# Patient Record
Sex: Female | Born: 1950 | Race: White | Hispanic: No | Smoking: Former smoker
Health system: Southern US, Community
[De-identification: ages and names within clinical notes are randomized; demographics above are authoritative.]

## PROBLEM LIST (undated history)

## (undated) DIAGNOSIS — E039 Hypothyroidism, unspecified: Secondary | ICD-10-CM

## (undated) DIAGNOSIS — C801 Malignant (primary) neoplasm, unspecified: Secondary | ICD-10-CM

## (undated) DIAGNOSIS — E119 Type 2 diabetes mellitus without complications: Secondary | ICD-10-CM

## (undated) DIAGNOSIS — E785 Hyperlipidemia, unspecified: Secondary | ICD-10-CM

## (undated) DIAGNOSIS — I10 Essential (primary) hypertension: Secondary | ICD-10-CM

## (undated) DIAGNOSIS — I2510 Atherosclerotic heart disease of native coronary artery without angina pectoris: Secondary | ICD-10-CM

## (undated) HISTORY — PX: COLONOSCOPY WITH PROPOFOL: SHX5780

## (undated) HISTORY — PX: CHOLECYSTECTOMY: SHX55

## (undated) HISTORY — PX: VAGINAL HYSTERECTOMY: SUR661

---

## 2003-06-21 ENCOUNTER — Other Ambulatory Visit: Payer: Self-pay

## 2004-07-21 ENCOUNTER — Ambulatory Visit: Payer: Self-pay | Admitting: General Practice

## 2005-01-11 ENCOUNTER — Ambulatory Visit: Payer: Self-pay | Admitting: Endocrinology

## 2005-03-16 ENCOUNTER — Emergency Department: Payer: Self-pay | Admitting: Emergency Medicine

## 2005-03-16 ENCOUNTER — Other Ambulatory Visit: Payer: Self-pay

## 2005-03-21 ENCOUNTER — Other Ambulatory Visit: Payer: Self-pay

## 2005-03-21 ENCOUNTER — Ambulatory Visit: Payer: Self-pay | Admitting: Internal Medicine

## 2005-04-19 ENCOUNTER — Encounter: Payer: Self-pay | Admitting: Internal Medicine

## 2005-04-28 ENCOUNTER — Encounter: Payer: Self-pay | Admitting: Internal Medicine

## 2005-05-29 ENCOUNTER — Encounter: Payer: Self-pay | Admitting: Internal Medicine

## 2005-06-28 ENCOUNTER — Encounter: Payer: Self-pay | Admitting: Internal Medicine

## 2005-07-06 ENCOUNTER — Ambulatory Visit: Payer: Self-pay | Admitting: Gastroenterology

## 2006-06-07 ENCOUNTER — Ambulatory Visit: Payer: Self-pay | Admitting: Gastroenterology

## 2008-01-16 ENCOUNTER — Ambulatory Visit: Payer: Self-pay | Admitting: Gastroenterology

## 2010-05-24 ENCOUNTER — Ambulatory Visit: Payer: Self-pay | Admitting: Family Medicine

## 2010-11-25 ENCOUNTER — Ambulatory Visit: Payer: Self-pay | Admitting: Family Medicine

## 2011-03-07 ENCOUNTER — Ambulatory Visit: Payer: Self-pay | Admitting: Gastroenterology

## 2012-01-03 ENCOUNTER — Ambulatory Visit: Payer: Self-pay | Admitting: Family Medicine

## 2013-01-03 ENCOUNTER — Ambulatory Visit: Payer: Self-pay | Admitting: Family Medicine

## 2014-01-06 ENCOUNTER — Ambulatory Visit: Payer: Self-pay | Admitting: Nurse Practitioner

## 2014-09-05 ENCOUNTER — Emergency Department
Admission: EM | Admit: 2014-09-05 | Discharge: 2014-09-05 | Disposition: A | Discharge status: Home / Self Care | Payer: BLUE CROSS/BLUE SHIELD | Attending: Emergency Medicine | Admitting: Emergency Medicine

## 2014-09-05 DIAGNOSIS — S91112A Laceration without foreign body of left great toe without damage to nail, initial encounter: Secondary | ICD-10-CM | POA: Diagnosis present

## 2014-09-05 DIAGNOSIS — Z23 Encounter for immunization: Secondary | ICD-10-CM | POA: Insufficient documentation

## 2014-09-05 DIAGNOSIS — Y9289 Other specified places as the place of occurrence of the external cause: Secondary | ICD-10-CM | POA: Insufficient documentation

## 2014-09-05 DIAGNOSIS — Y9389 Activity, other specified: Secondary | ICD-10-CM | POA: Insufficient documentation

## 2014-09-05 DIAGNOSIS — Y998 Other external cause status: Secondary | ICD-10-CM | POA: Insufficient documentation

## 2014-09-05 DIAGNOSIS — Y288XXA Contact with other sharp object, undetermined intent, initial encounter: Secondary | ICD-10-CM | POA: Diagnosis not present

## 2014-09-05 MED ORDER — TETANUS-DIPHTH-ACELL PERTUSSIS 5-2.5-18.5 LF-MCG/0.5 IM SUSP
INTRAMUSCULAR | Status: DC
Start: 2014-09-05 — End: 2014-09-06
  Filled 2014-09-05: qty 0.5

## 2014-09-05 MED ORDER — TETANUS-DIPHTH-ACELL PERTUSSIS 5-2.5-18.5 LF-MCG/0.5 IM SUSP
0.5000 mL | Freq: Once | INTRAMUSCULAR | Status: AC
  Administered 2014-09-05: 0.5 mL via INTRAMUSCULAR

## 2014-09-05 MED ORDER — SULFAMETHOXAZOLE-TRIMETHOPRIM 800-160 MG PO TABS: 1.0000 | ORAL_TABLET | Freq: Two times a day (BID) | ORAL | Status: DC

## 2014-09-05 MED ORDER — CIPROFLOXACIN HCL 500 MG PO TABS: 500.0000 mg | ORAL_TABLET | Freq: Two times a day (BID) | ORAL | Status: DC

## 2014-09-05 MED ORDER — LIDOCAINE HCL (PF) 1 % IJ SOLN: 2.0000 mL | Freq: Once | INTRAMUSCULAR | Status: DC

## 2014-09-05 MED ORDER — TRAMADOL HCL 50 MG PO TABS: 50.0000 mg | ORAL_TABLET | Freq: Four times a day (QID) | ORAL | Status: DC | PRN

## 2014-09-05 MED ORDER — LIDOCAINE HCL (PF) 1 % IJ SOLN
INTRAMUSCULAR | Status: DC
Start: 2014-09-05 — End: 2014-09-06
  Filled 2014-09-05: qty 5

## 2014-09-05 NOTE — Discharge Instructions (Signed)

## 2014-09-05 NOTE — ED Notes (Signed)
Patient with no complaints at this time. Respirations even and unlabored. Skin warm/dry. Discharge instructions reviewed with patient at this time. Patient given opportunity to voice concerns/ask questions. Patient discharged at this time and left Emergency Department with steady gait.   

## 2014-09-05 NOTE — ED Provider Notes (Signed)
CSN: [id number]     Arrival date & time 09/05/14  2108 History   First MD Initiated Contact with Patient 09/05/14 2133     Chief Complaint  Patient presents with  . Extremity Laceration     (Consider location/radiation/quality/duration/timing/severity/associated sxs/prior Treatment) HPI  64 year old female presents to the emergency department ambulatory for evaluation of left great toe laceration. Patient states she cut the bottom of her left great toe on a piece of metal that was bent after she had vacuumed her living room floor. Piece of metal was from the air conditioning vent. Pain is 4 out of 10. Her tetanus is not up-to-date. She is able to ambulate. Is not a medications for pain. She denies any numbness or tingling.   No past medical history on file. No past surgical history on file. No family history on file. History  Substance Use Topics  . Smoking status: Not on file  . Smokeless tobacco: Not on file  . Alcohol Use: Not on file   OB History    No data available     Review of Systems  Constitutional: Negative for fever, chills, activity change and fatigue.  HENT: Negative for congestion, sinus pressure and sore throat.   Eyes: Negative for visual disturbance.  Respiratory: Negative for cough, chest tightness and shortness of breath.   Cardiovascular: Negative for chest pain and leg swelling.  Gastrointestinal: Negative for nausea, vomiting, abdominal pain and diarrhea.  Genitourinary: Negative for dysuria.  Musculoskeletal: Negative for arthralgias and gait problem.  Skin: Positive for wound.  Neurological: Negative for weakness, numbness and headaches.  Hematological: Negative for adenopathy.  Psychiatric/Behavioral: Negative for behavioral problems, confusion and agitation.      Allergies  Review of patient's allergies indicates no known allergies.  Home Medications   Prior to Admission medications   Medication Sig Start Date End Date Taking? Authorizing  Provider  ciprofloxacin (CIPRO) 500 MG tablet Take 1 tablet (500 mg total) by mouth 2 (two) times daily. 09/05/14   Duanne Guess, PA-C  sulfamethoxazole-trimethoprim (BACTRIM DS,SEPTRA DS) 800-160 MG per tablet Take 1 tablet by mouth 2 (two) times daily. 09/05/14   Duanne Guess, PA-C  traMADol (ULTRAM) 50 MG tablet Take 1 tablet (50 mg total) by mouth every 6 (six) hours as needed. 09/05/14   Duanne Guess, PA-C   BP 159/63 mmHg  Pulse 76  Temp(Src) 97.6 F (36.4 C) (Oral)  Resp 18  Ht 5\' 3"  (1.6 m)  Wt 154 lb (69.854 kg)  BMI 27.29 kg/m2  SpO2 96% Physical Exam  Constitutional: She is oriented to person, place, and time. She appears well-developed and well-nourished. No distress.  HENT:  Head: Normocephalic and atraumatic.  Mouth/Throat: Oropharynx is clear and moist.  Eyes: EOM are normal. Pupils are equal, round, and reactive to light. Right eye exhibits no discharge. Left eye exhibits no discharge.  Neck: Normal range of motion. Neck supple.  Cardiovascular: Normal rate and intact distal pulses.   Pulmonary/Chest: Effort normal. No respiratory distress.  Musculoskeletal: Normal range of motion. She exhibits no edema.  Normal range of motion of the right knee and ankle and toes. No deformity noted. No tendon deficits noted to right lower extremity.  Neurological: She is alert and oriented to person, place, and time. She has normal reflexes.  Skin: Skin is warm and dry.  5 cm laceration noted to the plantar aspect of the left great toe. Laceration appears to be a flap. No foreign body. No surrounding erythema  or drainage. He  Psychiatric: She has a normal mood and affect. Her behavior is normal. Thought content normal.    ED Course  Procedures (including critical care time) LACERATION REPAIR Performed by: Feliberto Gottron Authorized by: Feliberto Gottron Consent: Verbal consent obtained. Risks and benefits: risks, benefits and alternatives were  discussed Consent given by: patient Patient identity confirmed: provided demographic data Prepped and Draped in normal sterile fashion Wound explored  Laceration Location: Left great toe plantar aspect  Laceration Length: 6 cm  No Foreign Bodies seen or palpated  Anesthesia: local infiltration  Local anesthetic: lidocaine 1% % without epinephrine  Anesthetic total: 3 ml  Irrigation method: syringe Amount of cleaning: standard  Skin closure: 4-0 nylon   Number of sutures: 5  Technique: Simple interrupted   Patient tolerance: Patient tolerated the procedure well with no immediate complications.  Labs Review Labs Reviewed - No data to display  Imaging Review No results found.   EKG Interpretation None      MDM   Final diagnoses:  Laceration of great toe, left, initial encounter    64 year old female with laceration to the left great toe. Laceration was irrigated then repaired. She was placed on prophylactic antibiotic, Bactrim, Cipro. Follow-up in 7 days for wound check and suture removal.    Duanne Guess, PA-C 09/05/14 2201  Lavonia Drafts, MD 09/05/14 [phone]

## 2014-09-05 NOTE — ED Notes (Signed)
Patient reports stepped on floor vent at home and now with laceration to left great toe down to sole of foot.

## 2014-12-31 ENCOUNTER — Emergency Department: Payer: BLUE CROSS/BLUE SHIELD

## 2014-12-31 ENCOUNTER — Inpatient Hospital Stay
Admission: EM | Admit: 2014-12-31 | Discharge: 2015-01-01 | DRG: 193 | Disposition: A | Discharge status: Home / Self Care | Payer: BLUE CROSS/BLUE SHIELD | Attending: Internal Medicine | Admitting: Internal Medicine

## 2014-12-31 ENCOUNTER — Encounter: Payer: Self-pay | Admitting: Emergency Medicine

## 2014-12-31 DIAGNOSIS — I2510 Atherosclerotic heart disease of native coronary artery without angina pectoris: Secondary | ICD-10-CM | POA: Diagnosis present

## 2014-12-31 DIAGNOSIS — J9691 Respiratory failure, unspecified with hypoxia: Secondary | ICD-10-CM | POA: Diagnosis present

## 2014-12-31 DIAGNOSIS — E119 Type 2 diabetes mellitus without complications: Secondary | ICD-10-CM | POA: Diagnosis present

## 2014-12-31 DIAGNOSIS — Z8249 Family history of ischemic heart disease and other diseases of the circulatory system: Secondary | ICD-10-CM | POA: Diagnosis not present

## 2014-12-31 DIAGNOSIS — E039 Hypothyroidism, unspecified: Secondary | ICD-10-CM | POA: Diagnosis present

## 2014-12-31 DIAGNOSIS — Z9049 Acquired absence of other specified parts of digestive tract: Secondary | ICD-10-CM

## 2014-12-31 DIAGNOSIS — R0603 Acute respiratory distress: Secondary | ICD-10-CM

## 2014-12-31 DIAGNOSIS — E785 Hyperlipidemia, unspecified: Secondary | ICD-10-CM | POA: Diagnosis present

## 2014-12-31 DIAGNOSIS — I10 Essential (primary) hypertension: Secondary | ICD-10-CM | POA: Diagnosis present

## 2014-12-31 DIAGNOSIS — J189 Pneumonia, unspecified organism: Principal | ICD-10-CM | POA: Diagnosis present

## 2014-12-31 DIAGNOSIS — Z79899 Other long term (current) drug therapy: Secondary | ICD-10-CM

## 2014-12-31 DIAGNOSIS — Z87891 Personal history of nicotine dependence: Secondary | ICD-10-CM | POA: Diagnosis not present

## 2014-12-31 DIAGNOSIS — Z7982 Long term (current) use of aspirin: Secondary | ICD-10-CM

## 2014-12-31 DIAGNOSIS — Z90710 Acquired absence of both cervix and uterus: Secondary | ICD-10-CM | POA: Diagnosis not present

## 2014-12-31 DIAGNOSIS — Z818 Family history of other mental and behavioral disorders: Secondary | ICD-10-CM

## 2014-12-31 DIAGNOSIS — R0600 Dyspnea, unspecified: Secondary | ICD-10-CM | POA: Diagnosis present

## 2014-12-31 DIAGNOSIS — R0902 Hypoxemia: Secondary | ICD-10-CM

## 2014-12-31 HISTORY — DX: Hyperlipidemia, unspecified: E78.5

## 2014-12-31 HISTORY — DX: Type 2 diabetes mellitus without complications: E11.9

## 2014-12-31 HISTORY — DX: Atherosclerotic heart disease of native coronary artery without angina pectoris: I25.10

## 2014-12-31 HISTORY — DX: Hypothyroidism, unspecified: E03.9

## 2014-12-31 HISTORY — DX: Essential (primary) hypertension: I10

## 2014-12-31 LAB — COMPREHENSIVE METABOLIC PANEL
ALT: 17 U/L (ref 14–54)
ANION GAP: 8 (ref 5–15)
AST: 21 U/L (ref 15–41)
Albumin: 4.1 g/dL (ref 3.5–5.0)
Alkaline Phosphatase: 145 U/L — ABNORMAL HIGH (ref 38–126)
BUN: 17 mg/dL (ref 6–20)
CHLORIDE: 109 mmol/L (ref 101–111)
CO2: 24 mmol/L (ref 22–32)
Calcium: 9.9 mg/dL (ref 8.9–10.3)
Creatinine, Ser: 0.96 mg/dL (ref 0.44–1.00)
GFR calc non Af Amer: >60 mL/min (ref >60)
Glucose, Bld: 151 mg/dL — ABNORMAL HIGH (ref 65–99)
Potassium: 3.8 mmol/L (ref 3.5–5.1)
SODIUM: 141 mmol/L (ref 135–145)
Total Bilirubin: 0.5 mg/dL (ref 0.3–1.2)
Total Protein: 7.6 g/dL (ref 6.5–8.1)

## 2014-12-31 LAB — BLOOD GAS, VENOUS
Acid-Base Excess: 1.2 mmol/L (ref 0.0–3.0)
Bicarbonate: 26.6 mEq/L (ref 21.0–28.0)
PH VEN: 7.39 (ref 7.320–7.430)
Patient temperature: 37
pCO2, Ven: 44 mmHg (ref 44.0–60.0)

## 2014-12-31 LAB — GLUCOSE, CAPILLARY
Glucose-Capillary: 170 mg/dL — ABNORMAL HIGH (ref 65–99)
Glucose-Capillary: 151 mg/dL — ABNORMAL HIGH (ref 65–99)
Glucose-Capillary: 109 mg/dL — ABNORMAL HIGH (ref 65–99)
Glucose-Capillary: 111 mg/dL — ABNORMAL HIGH (ref 65–99)

## 2014-12-31 LAB — CBC
HCT: 34.4 % — ABNORMAL LOW (ref 35.0–47.0)
Hemoglobin: 11.1 g/dL — ABNORMAL LOW (ref 12.0–16.0)
MCH: 26.9 pg (ref 26.0–34.0)
MCHC: 32.3 g/dL (ref 32.0–36.0)
MCV: 83.3 fL (ref 80.0–100.0)
PLATELETS: 232 10*3/uL (ref 150–440)
RBC: 4.13 MIL/uL (ref 3.80–5.20)
RDW: 14.4 % (ref 11.5–14.5)
WBC: 11.6 10*3/uL — ABNORMAL HIGH (ref 3.6–11.0)

## 2014-12-31 LAB — TROPONIN I: Troponin I: <0.03 ng/mL (ref <0.031)

## 2014-12-31 LAB — TSH: TSH: 1.46 u[IU]/mL (ref 0.350–4.500)

## 2014-12-31 LAB — BRAIN NATRIURETIC PEPTIDE: B NATRIURETIC PEPTIDE 5: 67 pg/mL (ref 0.0–100.0)

## 2014-12-31 MED ORDER — ACETAMINOPHEN 325 MG PO TABS: 650.0000 mg | ORAL_TABLET | Freq: Four times a day (QID) | ORAL | Status: DC | PRN

## 2014-12-31 MED ORDER — SODIUM CHLORIDE 0.9 % IJ SOLN
3.0000 mL | Freq: Two times a day (BID) | INTRAMUSCULAR | Status: DC
  Administered 2014-12-31: 3 mL via INTRAVENOUS

## 2014-12-31 MED ORDER — ISOSORBIDE MONONITRATE ER 30 MG PO TB24
30.0000 mg | ORAL_TABLET | Freq: Every day | ORAL | Status: DC
  Administered 2014-12-31 – 2015-01-01 (×2): 30 mg via ORAL
  Filled 2014-12-31 (×2): qty 1

## 2014-12-31 MED ORDER — ONDANSETRON HCL 4 MG/2ML IJ SOLN: 4.0000 mg | Freq: Four times a day (QID) | INTRAMUSCULAR | Status: DC | PRN

## 2014-12-31 MED ORDER — DOCUSATE SODIUM 100 MG PO CAPS
100.0000 mg | ORAL_CAPSULE | Freq: Two times a day (BID) | ORAL | Status: DC
  Administered 2014-12-31: 100 mg via ORAL
  Filled 2014-12-31 (×3): qty 1

## 2014-12-31 MED ORDER — LISINOPRIL 5 MG PO TABS
2.5000 mg | ORAL_TABLET | Freq: Every day | ORAL | Status: DC
  Administered 2014-12-31 – 2015-01-01 (×2): 2.5 mg via ORAL
  Filled 2014-12-31: qty 2
  Filled 2014-12-31: qty 1

## 2014-12-31 MED ORDER — DEXTROSE 5 % IV SOLN
1.0000 g | Freq: Once | INTRAVENOUS | Status: AC
  Administered 2014-12-31: 1 g via INTRAVENOUS
  Filled 2014-12-31: qty 10

## 2014-12-31 MED ORDER — INSULIN ASPART 100 UNIT/ML ~~LOC~~ SOLN: 0.0000 [IU] | Freq: Every day | SUBCUTANEOUS | Status: DC

## 2014-12-31 MED ORDER — ACETAMINOPHEN 650 MG RE SUPP
650.0000 mg | Freq: Four times a day (QID) | RECTAL | Status: DC | PRN
Start: 2014-12-31 — End: 2015-01-01

## 2014-12-31 MED ORDER — VANCOMYCIN HCL IN DEXTROSE 1-5 GM/200ML-% IV SOLN: 1000.0000 mg | INTRAVENOUS | Status: DC

## 2014-12-31 MED ORDER — IPRATROPIUM-ALBUTEROL 0.5-2.5 (3) MG/3ML IN SOLN
RESPIRATORY_TRACT | Status: AC
  Administered 2014-12-31: 9 mL
  Filled 2014-12-31: qty 9

## 2014-12-31 MED ORDER — ATORVASTATIN CALCIUM 20 MG PO TABS
40.0000 mg | ORAL_TABLET | Freq: Every day | ORAL | Status: DC
  Administered 2014-12-31: 40 mg via ORAL
  Filled 2014-12-31: qty 2

## 2014-12-31 MED ORDER — METOPROLOL TARTRATE 25 MG PO TABS
25.0000 mg | ORAL_TABLET | Freq: Two times a day (BID) | ORAL | Status: DC
  Administered 2014-12-31 – 2015-01-01 (×3): 25 mg via ORAL
  Filled 2014-12-31 (×3): qty 1

## 2014-12-31 MED ORDER — LEVOTHYROXINE SODIUM 88 MCG PO TABS
88.0000 ug | ORAL_TABLET | Freq: Every day | ORAL | Status: DC
  Administered 2014-12-31 – 2015-01-01 (×2): 88 ug via ORAL
  Filled 2014-12-31 (×2): qty 1

## 2014-12-31 MED ORDER — VANCOMYCIN HCL IN DEXTROSE 1-5 GM/200ML-% IV SOLN
1000.0000 mg | INTRAVENOUS | Status: DC
  Administered 2014-12-31 – 2015-01-01 (×2): 1000 mg via INTRAVENOUS
  Filled 2014-12-31 (×2): qty 200

## 2014-12-31 MED ORDER — CLOPIDOGREL BISULFATE 75 MG PO TABS
75.0000 mg | ORAL_TABLET | Freq: Every day | ORAL | Status: DC
  Administered 2014-12-31: 75 mg via ORAL
  Filled 2014-12-31: qty 1

## 2014-12-31 MED ORDER — VANCOMYCIN HCL IN DEXTROSE 1-5 GM/200ML-% IV SOLN
1000.0000 mg | Freq: Once | INTRAVENOUS | Status: AC
Start: 2014-12-31 — End: 2014-12-31
  Administered 2014-12-31: 1000 mg via INTRAVENOUS
  Filled 2014-12-31: qty 200

## 2014-12-31 MED ORDER — DEXTROSE 5 % IV SOLN
1.0000 g | INTRAVENOUS | Status: DC
  Administered 2015-01-01: 1 g via INTRAVENOUS
  Filled 2014-12-31: qty 10

## 2014-12-31 MED ORDER — DEXTROSE 5 % IV SOLN
500.0000 mg | INTRAVENOUS | Status: DC
  Filled 2014-12-31: qty 500

## 2014-12-31 MED ORDER — DEXTROSE 5 % IV SOLN
500.0000 mg | Freq: Once | INTRAVENOUS | Status: AC
  Administered 2014-12-31: 500 mg via INTRAVENOUS
  Filled 2014-12-31: qty 500

## 2014-12-31 MED ORDER — INSULIN ASPART 100 UNIT/ML ~~LOC~~ SOLN
0.0000 [IU] | Freq: Three times a day (TID) | SUBCUTANEOUS | Status: DC
  Administered 2014-12-31 (×2): 2 [IU] via SUBCUTANEOUS
  Filled 2014-12-31 (×2): qty 2

## 2014-12-31 MED ORDER — SODIUM CHLORIDE 0.9 % IV SOLN
INTRAVENOUS | Status: DC
  Administered 2014-12-31: 06:00:00 via INTRAVENOUS

## 2014-12-31 MED ORDER — ONDANSETRON HCL 4 MG PO TABS: 4.0000 mg | ORAL_TABLET | Freq: Four times a day (QID) | ORAL | Status: DC | PRN

## 2014-12-31 MED ORDER — HEPARIN SODIUM (PORCINE) 5000 UNIT/ML IJ SOLN
5000.0000 [IU] | Freq: Three times a day (TID) | INTRAMUSCULAR | Status: DC
  Administered 2014-12-31: 5000 [IU] via SUBCUTANEOUS
  Filled 2014-12-31: qty 1

## 2014-12-31 MED ORDER — ASPIRIN 325 MG PO TABS
325.0000 mg | ORAL_TABLET | Freq: Every day | ORAL | Status: DC
  Administered 2014-12-31 – 2015-01-01 (×2): 325 mg via ORAL
  Filled 2014-12-31 (×2): qty 1

## 2014-12-31 MED ORDER — IPRATROPIUM-ALBUTEROL 0.5-2.5 (3) MG/3ML IN SOLN: 3.0000 mL | RESPIRATORY_TRACT | Status: DC | PRN

## 2014-12-31 NOTE — Progress Notes (Signed)
[facility] NAME: Kathleen [name]    MR#:  [id number]  DATE OF BIRTH:  08-09-1950  SUBJECTIVE: Admitted this morning for a hypoxic respiratory failure due to pneumonia.   seen this morning, she says she feels better. Less cough, less shortness of breath. Oxygen saturations 93% on room air.   CHIEF COMPLAINT:   Chief Complaint  Patient presents with  . Shortness of Breath    REVIEW OF SYSTEMS:   ROS CONSTITUTIONAL: No fever, fatigue or weakness.  EYES: No blurred or double vision.  EARS, NOSE, AND THROAT: No tinnitus or ear pain.  RESPIRATORY: No cough, shortness of breath, wheezing or hemoptysis.  CARDIOVASCULAR: No chest pain, orthopnea, edema.  GASTROINTESTINAL: No nausea, vomiting, diarrhea or abdominal pain.  GENITOURINARY: No dysuria, hematuria.  ENDOCRINE: No polyuria, nocturia,  HEMATOLOGY: No anemia, easy bruising or bleeding SKIN: No rash or lesion. MUSCULOSKELETAL: No joint pain or arthritis.   NEUROLOGIC: No tingling, numbness, weakness.  PSYCHIATRY: No anxiety or depression.   DRUG ALLERGIES:  No Known Allergies  VITALS:  Blood pressure 135/58, pulse 82, temperature 98.2 F (36.8 C), temperature source Oral, resp. rate 20, height 5\' 3"  (1.6 m), weight 73.664 kg (162 lb 6.4 oz), SpO2 93 %.  PHYSICAL EXAMINATION:  GENERAL:  64 y.o.-year-old patient lying in the bed with no acute distress.  EYES: Pupils equal, round, reactive to light and accommodation. No scleral icterus. Extraocular muscles intact.  HEENT: Head atraumatic, normocephalic. Oropharynx and nasopharynx clear.  NECK:  Supple, no jugular venous distention. No thyroid enlargement, no tenderness.  LUNGS: Normal breath sounds bilaterally, no wheezing, rales,rhonchi or crepitation. No use of accessory muscles of respiration.  CARDIOVASCULAR: S1, S2 normal. No murmurs, rubs, or gallops.  ABDOMEN: Soft, nontender, nondistended. Bowel sounds present.  No organomegaly or mass.  EXTREMITIES: No pedal edema, cyanosis, or clubbing.  NEUROLOGIC: Cranial nerves II through XII are intact. Muscle strength 5/5 in all extremities. Sensation intact. Gait not checked.  PSYCHIATRIC: The patient is alert and oriented x 3.  SKIN: No obvious rash, lesion, or ulcer.    LABORATORY PANEL:   CBC  Recent Labs Lab 12/31/14 0058  WBC 11.6*  HGB 11.1*  HCT 34.4*  PLT 232   ------------------------------------------------------------------------------------------------------------------  Chemistries   Recent Labs Lab 12/31/14 0058  NA 141  K 3.8  CL 109  CO2 24  GLUCOSE 151*  BUN 17  CREATININE 0.96  CALCIUM 9.9  AST 21  ALT 17  ALKPHOS 145*  BILITOT 0.5   ------------------------------------------------------------------------------------------------------------------  Cardiac Enzymes  Recent Labs Lab 12/31/14 0058  TROPONINI <0.03   ------------------------------------------------------------------------------------------------------------------  RADIOLOGY:  Dg Chest Portable 1 View  12/31/2014  CLINICAL DATA:  Patient awoke 11 p.m. with difficulty breathing and nonproductive cough. Shortness of breath and wheezing. EXAM: PORTABLE CHEST 1 VIEW COMPARISON:  03/16/2005 FINDINGS: Shallow inspiration with elevation of the right hemidiaphragm. There is infiltration in the right lung base causing a obscure a shin of the right heart border. This could be due to atelectasis or pneumonia. No blunting of costophrenic angles. No pneumothorax. Left lung is clear. Normal heart size and pulmonary vascularity. Calcification of the aorta. IMPRESSION: Infiltration or atelectasis in the right lung base. Electronically Signed   By: Lucienne Capers M.D.   On: 12/31/2014 01:15    EKG:   Orders placed or performed in visit on 03/21/05  . EKG 12-Lead    ASSESSMENT AND PLAN:  #1 respiratory failure with  hypoxia secondary to pneumonia: As  community-acquired pneumonia: Clinically improving with IV vancomycin and Rocephin, Zithromax. Continue Duonebs. . Follow cultures. #2 coronary artery disease: Stable continue aspirin, Plavix. #3 sepsis present on admission with tachypnea and leukocytosis: Symptoms improved. Follow culture data. Continue IV fluids. #4 diabetes mellitus type 2 continue sliding scale with coverage, restart the metformin. #5 hyperlipidemia continue statins. #6 hypothyroidism: Continue Synthyroid. 7 .hypertension continue lisinopril, metoprolol overall well controlled. Likely discharge tomorrow. Discussed with patient's daughter at bedside.  All the records are reviewed and case discussed with Care Management/Social Workerr. Management plans discussed with the patient, family and they are in agreement.  CODE STATUS: full  TOTAL TIME TAKING CARE OF THIS PATIENT:35 minutes.   POSSIBLE D/C IN 1-2 DAYS, DEPENDING ON CLINICAL CONDITION.   Epifanio Lesches M.D on 12/31/2014 at 9:13 AM  Between 7am to 6pm - Pager - [phone]  After 6pm go to [email or web address] - password [email or web address] [facility]  Office  [phone]  CC: Primary care physician; WHITE, Arlie Solomons, FNP   Note: This dictation was prepared with Dragon dictation along with smaller phrase technology. Any transcriptional errors that result from this process are unintentional.

## 2014-12-31 NOTE — Progress Notes (Signed)
Pt progressing well today.  On room air.  Ambulates on own

## 2014-12-31 NOTE — ED Notes (Signed)
Pt reports breathing easier after having DUO NEB treatment.

## 2014-12-31 NOTE — ED Provider Notes (Signed)
[facility] Emergency Department Provider Note  ____________________________________________  Time seen: Approximately 0038 AM  I have reviewed the triage vital signs and the nursing notes.   HISTORY  Chief Complaint Shortness of Breath    [name] is a 64 y.o. female who comes into the hospital today with shortness of breath. The patient reports that she woke up with trouble breathing. She has a history of heart disease diabetes and COPD but has never used inhalers for her COPD. The patient reports that she had a runny nose over the last few days and developed a cough tonight. The patient reports that when she woke up she did not use any inhalers. She denies any chest pain, abdominal pain, nausea or vomiting. The patient reports that she had a headache earlier but took 2 ibuprofen and it improved. The patient reports that she does not use any oxygen at home. She is unsure what was going on so she decided to come in for evaluation.   Past Medical History  Diagnosis Date  . HTN (hypertension)   . Type 2 diabetes mellitus ([place])   . HLD (hyperlipidemia)   . Hypothyroidism   . CAD (coronary artery disease)     There are no active problems to display for this patient.   Past Surgical History  Procedure Laterality Date  . Vaginal hysterectomy    . Cholecystectomy      Current Outpatient Rx  Name  Route  Sig  Dispense  Refill  . aspirin 325 MG tablet   Oral   Take 325 mg by mouth daily.         Marland Kitchen atorvastatin (LIPITOR) 40 MG tablet   Oral   Take 40 mg by mouth daily at 6 PM.         . clopidogrel (PLAVIX) 75 MG tablet   Oral   Take 75 mg by mouth daily.         . isosorbide mononitrate (IMDUR) 30 MG 24 hr tablet   Oral   Take 30 mg by mouth daily.         Marland Kitchen levothyroxine (SYNTHROID, LEVOTHROID) 88 MCG tablet   Oral   Take 88 mcg by mouth daily before breakfast.         . lisinopril (PRINIVIL,ZESTRIL) 2.5 MG tablet    Oral   Take 2.5 mg by mouth daily.         . metFORMIN (GLUCOPHAGE) 1000 MG tablet   Oral   Take 1,000 mg by mouth 2 (two) times daily with a meal.         . metoprolol tartrate (LOPRESSOR) 25 MG tablet   Oral   Take 25 mg by mouth 2 (two) times daily.         . ciprofloxacin (CIPRO) 500 MG tablet   Oral   Take 1 tablet (500 mg total) by mouth 2 (two) times daily.   14 tablet   0   . sulfamethoxazole-trimethoprim (BACTRIM DS,SEPTRA DS) 800-160 MG per tablet   Oral   Take 1 tablet by mouth 2 (two) times daily.   14 tablet   0   . traMADol (ULTRAM) 50 MG tablet   Oral   Take 1 tablet (50 mg total) by mouth every 6 (six) hours as needed.   20 tablet   0     Allergies Review of patient's allergies indicates no known allergies.  Family History  Problem Relation Age of Onset  . Heart  failure Father   . Mental illness Mother     Social History Social History  Substance Use Topics  . Smoking status: Former Research scientist (life sciences)  . Smokeless tobacco: None  . Alcohol Use: No    Review of Systems Constitutional: No fever/chills Eyes: No visual changes. ENT: No sore throat. Cardiovascular: Denies chest pain. Respiratory:  shortness of breath. Gastrointestinal: No abdominal pain.  No nausea, no vomiting.  No diarrhea.  No constipation. Genitourinary: Negative for dysuria. Musculoskeletal: Negative for back pain. Skin: Negative for rash. Neurological: Headache  10-point ROS otherwise negative.  ____________________________________________   PHYSICAL EXAM:  VITAL SIGNS: ED Triage Vitals  Enc Vitals Group     BP 12/31/14 0029 161/62 mmHg     Pulse Rate 12/31/14 0029 78     Resp 12/31/14 0029 30     Temp 12/31/14 0029 97.7 F (36.5 C)     Temp Source 12/31/14 0029 Oral     SpO2 12/31/14 0029 86 %     Weight 12/31/14 0029 152 lb (68.947 kg)     Height 12/31/14 0029 5\' 3"  (1.6 m)     Head Cir --      Peak Flow --      Pain Score 12/31/14 0051 0     Pain Loc --       Pain Edu? --      Excl. in [place]? --     Constitutional: Alert and oriented. Well appearing and in moderate respiratory distress. Eyes: Conjunctivae are normal. PERRL. EOMI. Head: Atraumatic. Nose: No congestion/rhinnorhea. Mouth/Throat: Mucous membranes are moist.  Oropharynx non-erythematous. Cardiovascular: Normal rate, regular rhythm. Grossly normal heart sounds.  Good peripheral circulation. Respiratory: Increased respiratory effort with diffuse expiratory wheezes as well as crackles in the bilateral bases. Gastrointestinal: Soft and nontender. No distention. Positive bowel sounds Musculoskeletal: No lower extremity tenderness nor edema.   Neurologic:  Normal speech and language. No gross focal neurologic deficits are appreciated.  Skin:  Skin is warm, dry and intact.  Psychiatric: Mood and affect are normal.   ____________________________________________   LABS (all labs ordered are listed, but only abnormal results are displayed)  Labs Reviewed  CBC - Abnormal; Notable for the following:    WBC 11.6 (*)    Hemoglobin 11.1 (*)    HCT 34.4 (*)    All other components within normal limits  COMPREHENSIVE METABOLIC PANEL - Abnormal; Notable for the following:    Glucose, Bld 151 (*)    Alkaline Phosphatase 145 (*)    All other components within normal limits  CULTURE, BLOOD (ROUTINE X 2)  CULTURE, BLOOD (ROUTINE X 2)  TROPONIN I  BRAIN NATRIURETIC PEPTIDE  BLOOD GAS, VENOUS   ____________________________________________  EKG  ED ECG REPORT I, Loney Hering, the attending physician, personally viewed and interpreted this ECG.   Date: 12/31/2014  EKG Time: 0046  Rate: 73  Rhythm: normal sinus rhythm  Axis: normal  Intervals:none  ST&T Change: none  ____________________________________________  RADIOLOGY  Chest x-ray: Infiltration or atelectasis in the right lung base ____________________________________________   PROCEDURES  Procedure(s)  performed: None  Critical Care performed: Yes, see critical care note(s)  CRITICAL CARE Performed by: Charlesetta Ivory P   Total critical care time: 30 minutes  Critical care time was exclusive of separately billable procedures and treating other patients.  Critical care was necessary to treat or prevent imminent or life-threatening deterioration.  Critical care was time spent personally by me on the following activities: development  of treatment plan with patient and/or surrogate as well as nursing, discussions with consultants, evaluation of patient's response to treatment, examination of patient, obtaining history from patient or surrogate, ordering and performing treatments and interventions, ordering and review of laboratory studies, ordering and review of radiographic studies, pulse oximetry and re-evaluation of patient's condition.  ____________________________________________   INITIAL IMPRESSION / ASSESSMENT AND PLAN / ED COURSE  Pertinent labs & imaging results that were available during my care of the patient were reviewed by me and considered in my medical decision making (see chart for details).  This is a 64 year old female who comes in today with some shortness of breath. The patient was hypoxic on arrival to the emergency department. When I evaluated her her sats were in the high 80s on 2 L of oxygen by nasal cannula. I did give the patient to duo nebs given the wheezing and that did help to improve her oxygen saturation. The patient though continues to have some crackles in her bases. The patient has a white blood cell count of 11 but no elevated PCO2 nor does she have an elevated BNP. I feel that the patient may have pneumonia given the infiltration in her right lung base. I will give the patient some ceftriaxone as well as azithromycin. The patient continues to require oxygen after the DuoNeb's I will admit her to the hospitalist service for her pneumonia and hypoxia. I  discussed this with the patient and she has no further complaints at this time. I discussed the patient with Dr. Jannifer Franklin who will admit the patient.  ____________________________________________   FINAL CLINICAL IMPRESSION(S) / ED DIAGNOSES  Final diagnoses:  Community acquired pneumonia  Respiratory distress  Hypoxia      Loney Hering, MD 12/31/14 [phone]

## 2014-12-31 NOTE — Plan of Care (Signed)
Problem: Fluid Volume: Goal: Ability to maintain a balanced intake and output will improve Outcome: Progressing IV fluids infusing and antibiotics giving.

## 2014-12-31 NOTE — Progress Notes (Signed)
ANTIBIOTIC CONSULT NOTE - INITIAL  Pharmacy Consult for vancomycin dosing Indication: sepsis  No Known Allergies  Patient Measurements: Height: 5\' 3"  (160 cm) Weight: 162 lb 6.4 oz (73.664 kg) IBW/kg (Calculated) : 52.4 Adjusted Body Weight: 61kg  Vital Signs: Temp: 97.7 F (36.5 C) (11/02 0408) Temp Source: Oral (11/02 0408) BP: 137/58 mmHg (11/02 0408) Pulse Rate: 94 (11/02 0408) Intake/Output from previous day:   Intake/Output from this shift:    Labs:  Recent Labs  12/31/14 0058  WBC 11.6*  HGB 11.1*  PLT 232  CREATININE 0.96   Estimated Creatinine Clearance: 57.7 mL/min (by C-G formula based on Cr of 0.96). No results for input(s): VANCOTROUGH, VANCOPEAK, VANCORANDOM, GENTTROUGH, GENTPEAK, GENTRANDOM, TOBRATROUGH, TOBRAPEAK, TOBRARND, AMIKACINPEAK, AMIKACINTROU, AMIKACIN in the last 72 hours.   Microbiology: No results found for this or any previous visit (from the past 720 hour(s)).  Medical History: Past Medical History  Diagnosis Date  . HTN (hypertension)   . Type 2 diabetes mellitus ([place])   . HLD (hyperlipidemia)   . Hypothyroidism   . CAD (coronary artery disease)     Medications:  Also on ceftriaxone and azithromycin  Assessment: Blood cx pending CXR: R base atelectasis vs. infiltrate  Goal of Therapy:  Vancomycin trough level 15-20 mcg/ml  Plan:  TBW 73.7kg  IBW 52.4kg  DW 61kg  Vd 43L kei 0.053 hr-1  T1/2 13 hours 1 gram q 18 hours ordered with stacked dosing. Level before 5th dose.  Masin Shatto S 12/31/2014,5:45 AM

## 2014-12-31 NOTE — ED Notes (Signed)
Dr. Marcille Blanco in room to evaluate patient.

## 2014-12-31 NOTE — H&P (Signed)
Kathleen [name] is an 64 y.o. female.   Chief Complaint: Shortness of breath HPI: Complaining of shortness of breath. She states that she has had cough and some tightness in her chest associated with shortness of breath for a few days now. She denies fevers, nausea or vomiting. In the emergency department patient was found to have oxygen saturations approximately 87% on room air. Chest x-ray performed in the emergency department showed a right basilar opacity concerning for pneumonia. Due to her oxygen requirement and potential sepsis the emergency department staff called for admission.  Past Medical History  Diagnosis Date  . HTN (hypertension)   . Type 2 diabetes mellitus ([place])   . HLD (hyperlipidemia)   . Hypothyroidism   . CAD (coronary artery disease)     Past Surgical History  Procedure Laterality Date  . Vaginal hysterectomy    . Cholecystectomy      Family History  Problem Relation Age of Onset  . Heart failure Father   . Mental illness Mother    Social History:  reports that she has quit smoking. She does not have any smokeless tobacco history on file. She reports that she does not drink alcohol or use illicit drugs.  Allergies: No Known Allergies  Prior to Admission medications   Medication Sig Start Date End Date Taking? Authorizing Provider  aspirin 325 MG tablet Take 325 mg by mouth daily.   Yes Historical Provider, MD  atorvastatin (LIPITOR) 40 MG tablet Take 40 mg by mouth daily at 6 PM.   Yes Historical Provider, MD  clopidogrel (PLAVIX) 75 MG tablet Take 75 mg by mouth daily.   Yes Historical Provider, MD  isosorbide mononitrate (IMDUR) 30 MG 24 hr tablet Take 30 mg by mouth daily.   Yes Historical Provider, MD  levothyroxine (SYNTHROID, LEVOTHROID) 88 MCG tablet Take 88 mcg by mouth daily before breakfast.   Yes Historical Provider, MD  lisinopril (PRINIVIL,ZESTRIL) 2.5 MG tablet Take 2.5 mg by mouth daily.   Yes Historical Provider, MD  metFORMIN (GLUCOPHAGE) 1000  MG tablet Take 1,000 mg by mouth 2 (two) times daily with a meal.   Yes Historical Provider, MD  metoprolol tartrate (LOPRESSOR) 25 MG tablet Take 25 mg by mouth 2 (two) times daily.   Yes Historical Provider, MD  ciprofloxacin (CIPRO) 500 MG tablet Take 1 tablet (500 mg total) by mouth 2 (two) times daily. 09/05/14   Duanne Guess, PA-C  sulfamethoxazole-trimethoprim (BACTRIM DS,SEPTRA DS) 800-160 MG per tablet Take 1 tablet by mouth 2 (two) times daily. 09/05/14   Duanne Guess, PA-C  traMADol (ULTRAM) 50 MG tablet Take 1 tablet (50 mg total) by mouth every 6 (six) hours as needed. 09/05/14   Duanne Guess, PA-C     Results for orders placed or performed during the hospital encounter of 12/31/14 (from the past 48 hour(s))  CBC     Status: Abnormal   Collection Time: 12/31/14 12:58 AM  Result Value Ref Range   WBC 11.6 (H) 3.6 - 11.0 K/uL   RBC 4.13 3.80 - 5.20 MIL/uL   Hemoglobin 11.1 (L) 12.0 - 16.0 g/dL   HCT 34.4 (L) 35.0 - 47.0 %   MCV 83.3 80.0 - 100.0 fL   MCH 26.9 26.0 - 34.0 pg   MCHC 32.3 32.0 - 36.0 g/dL   RDW 14.4 11.5 - 14.5 %   Platelets 232 150 - 440 K/uL  Comprehensive metabolic panel     Status: Abnormal   Collection Time:  12/31/14 12:58 AM  Result Value Ref Range   Sodium 141 135 - 145 mmol/L   Potassium 3.8 3.5 - 5.1 mmol/L   Chloride 109 101 - 111 mmol/L   CO2 24 22 - 32 mmol/L   Glucose, Bld 151 (H) 65 - 99 mg/dL   BUN 17 6 - 20 mg/dL   Creatinine, Ser 0.96 0.44 - 1.00 mg/dL   Calcium 9.9 8.9 - 10.3 mg/dL   Total Protein 7.6 6.5 - 8.1 g/dL   Albumin 4.1 3.5 - 5.0 g/dL   AST 21 15 - 41 U/L   ALT 17 14 - 54 U/L   Alkaline Phosphatase 145 (H) 38 - 126 U/L   Total Bilirubin 0.5 0.3 - 1.2 mg/dL   GFR calc non Af Amer >60 >60 mL/min   GFR calc Af Amer >60 >60 mL/min    Comment: (NOTE) The eGFR has been calculated using the CKD EPI equation. This calculation has not been validated in all clinical situations. eGFR's persistently <60 mL/min signify possible  Chronic Kidney Disease.    Anion gap 8 5 - 15  Troponin I     Status: None   Collection Time: 12/31/14 12:58 AM  Result Value Ref Range   Troponin I <0.03 <0.031 ng/mL    Comment:        NO INDICATION OF MYOCARDIAL INJURY.   Brain natriuretic peptide     Status: None   Collection Time: 12/31/14 12:58 AM  Result Value Ref Range   B Natriuretic Peptide 67.0 0.0 - 100.0 pg/mL  Blood gas, venous     Status: None   Collection Time: 12/31/14  1:01 AM  Result Value Ref Range   pH, Ven 7.39 7.320 - 7.430   pCO2, Ven 44 44.0 - 60.0 mmHg   Bicarbonate 26.6 21.0 - 28.0 mEq/L   Acid-Base Excess 1.2 0.0 - 3.0 mmol/L   Patient temperature 37.0    Collection site VEIN    Sample type VEIN    Dg Chest Portable 1 View  12/31/2014  CLINICAL DATA:  Patient awoke 11 p.m. with difficulty breathing and nonproductive cough. Shortness of breath and wheezing. EXAM: PORTABLE CHEST 1 VIEW COMPARISON:  03/16/2005 FINDINGS: Shallow inspiration with elevation of the right hemidiaphragm. There is infiltration in the right lung base causing a obscure a shin of the right heart border. This could be due to atelectasis or pneumonia. No blunting of costophrenic angles. No pneumothorax. Left lung is clear. Normal heart size and pulmonary vascularity. Calcification of the aorta. IMPRESSION: Infiltration or atelectasis in the right lung base. Electronically Signed   By: Lucienne Capers M.D.   On: 12/31/2014 01:15    Review of Systems  Constitutional: Negative for fever and chills.  HENT: Negative for sore throat and tinnitus.   Eyes: Negative for blurred vision and redness.  Respiratory: Positive for cough and shortness of breath.   Cardiovascular: Negative for chest pain, palpitations, orthopnea and PND.  Gastrointestinal: Negative for nausea, vomiting, abdominal pain and diarrhea.  Genitourinary: Negative for dysuria, urgency and frequency.  Musculoskeletal: Negative for myalgias and joint pain.  Skin: Negative  for rash.       No lesions  Neurological: Negative for speech change, focal weakness and weakness.  Endo/Heme/Allergies: Does not bruise/bleed easily.       No temperature intolerance  Psychiatric/Behavioral: Negative for depression and suicidal ideas.    Blood pressure 156/74, pulse 93, temperature 97.7 F (36.5 C), temperature source Oral, resp. rate 19, height  '5\' 3"'  (1.6 m), weight 68.947 kg (152 lb), SpO2 100 %. Physical Exam  Nursing note and vitals reviewed. Constitutional: She is oriented to person, place, and time. She appears well-developed and well-nourished. No distress.  HENT:  Head: Normocephalic and atraumatic.  Mouth/Throat: Oropharynx is clear and moist.  Eyes: Conjunctivae and EOM are normal. Pupils are equal, round, and reactive to light. No scleral icterus.  Neck: Normal range of motion. Neck supple. No JVD present. No tracheal deviation present. No thyromegaly present.  Cardiovascular: Normal rate, regular rhythm and normal heart sounds.  Exam reveals no gallop and no friction rub.   No murmur heard. Respiratory: Tachypnea noted.  Rhonchi that clear with cough; faint wheezing in right lower lung field  GI: Soft. Bowel sounds are normal. She exhibits no distension. There is no tenderness.  Genitourinary:  Deferred  Musculoskeletal: Normal range of motion. She exhibits no edema.  Lymphadenopathy:    She has no cervical adenopathy.  Neurological: She is alert and oriented to person, place, and time. No cranial nerve deficit. She exhibits normal muscle tone.  Skin: Skin is warm and dry. No rash noted. No erythema.  Psychiatric: She has a normal mood and affect. Her behavior is normal. Judgment and thought content normal.     Assessment/Plan This is a 64 year old Caucasian female admitted for community-acquired pneumonia. 1. Community-acquired pneumonia: The patient is received azithromycin and ceftriaxone in the emergency department. I will add vancomycin due to  presence of sepsis criteria. Supplemental oxygen as needed. Breathing treatments every 4 hours as needed for wheezing or shortness of breath. The patient feels much better following her breathing treatment in the emergency department. 2. Sepsis: Patient meets criteria via tachypnea and leukocytosis. She is hemodynamically stable. Blood cultures have been obtained prior to IV antibiotics. Follow cultures 48 hours to rule out septicemia. 3. Coronary artery disease: Stable. Continue aspirin and Plavix 4. Hypertension: Continue lisinopril and metoprolol 5. Diabetes mellitus type 2: Hold metformin. Add sliding scale insulin on the patient is hospitalized 6. Hyperlipidemia: Continue statin therapy 7. Hypothyroidism: Continue Synthroid 8. DVT prophylaxis: Heparin 9. GI prophylaxis: None The patient is a full code. Time spent on admission orders and patient care approximately 35 minutes  Harrie Foreman 12/31/2014, 3:48 AM

## 2014-12-31 NOTE — Progress Notes (Signed)
Patient arrived to unit from ED. Patient alert and oriented. Skin CDI. VSS. Spo2 2L [place] intact. IV fluids infusing. Cardiac monitoring placed. Patient denies pain. No acute distress noted.

## 2014-12-31 NOTE — ED Notes (Signed)
Patient ambulatory to triage with steady gait, without difficulty or distress noted; pt reports awoke 11pm with diff breathing and nonprod cough; denies pain

## 2015-01-01 LAB — GLUCOSE, CAPILLARY
Glucose-Capillary: 116 mg/dL — ABNORMAL HIGH (ref 65–99)
Glucose-Capillary: 135 mg/dL — ABNORMAL HIGH (ref 65–99)

## 2015-01-01 MED ORDER — AZITHROMYCIN 500 MG PO TABS: 500.0000 mg | ORAL_TABLET | Freq: Every day | ORAL | Status: DC

## 2015-01-01 MED ORDER — AMOXICILLIN-POT CLAVULANATE 875-125 MG PO TABS: 1.0000 | ORAL_TABLET | Freq: Two times a day (BID) | ORAL | Status: DC

## 2015-01-01 MED ORDER — ALBUTEROL SULFATE HFA 108 (90 BASE) MCG/ACT IN AERS: 2.0000 | INHALATION_SPRAY | Freq: Four times a day (QID) | RESPIRATORY_TRACT | Status: DC | PRN

## 2015-01-01 NOTE — Progress Notes (Signed)
     Kathleen [name] was admitted to the Hospital on 12/31/2014 and Discharged  01/01/2015 and should be excused from work/school   for 4  days starting 12/31/2014 , may return to work/school without any restrictions.she can resume work on  01/05/15.    Epifanio Lesches M.D on 01/01/2015,at 11:11 AM

## 2015-01-01 NOTE — Discharge Summary (Signed)
Kathleen [name], is a 64 y.o. female  DOB [birth year]  MRN [id number].  Admission date:  12/31/2014  Admitting Physician  Harrie Foreman, MD  Discharge Date:  01/01/2015   Primary MD  WHITE, Arlie Solomons, FNP  Recommendations for primary care physician for things to follow:   Follow-up with primary doctor in 1 week.   Admission Diagnosis  Respiratory distress [R06.00] Community acquired pneumonia [J18.9] Hypoxia [R09.02]   Discharge Diagnosis  Respiratory distress [R06.00] Community acquired pneumonia [J18.9] Hypoxia [R09.02]    Active Problems:   CAP (community acquired pneumonia)      Past Medical History  Diagnosis Date  . HTN (hypertension)   . Type 2 diabetes mellitus ([place])   . HLD (hyperlipidemia)   . Hypothyroidism   . CAD (coronary artery disease)     Past Surgical History  Procedure Laterality Date  . Vaginal hysterectomy    . Cholecystectomy         History of present illness and  Hospital Course:     Kindly see H&P for history of present illness and admission details, please review complete Labs, Consult reports and Test reports for all details in brief  HPI  from the history and physical done on the day of admission  64 year old female patient with shortness of breath, cough, hypoxia with O2 sats 87% on room air, admitted for right lower lobe pneumonia. Hospital Course  #1 sepsis present on admission with evidence of tachypnea, leukocytosis secondary to pneumonia: Symptoms improved with IV antibiotics.blood cultures are negative.  2. Community acquired pneumonia with hypoxia improved with Rocephin, Zithromax, oxygen, breathing treatments.. Saturation 97% on room air. Discharge home with antibiotics Augmentin, erythromycin.  #3 coronary artery disease continue aspirin, Plavix. Her,  metoprolol. Statins. 4.Diabetes mellitus, type II; continue metformin at her discharge. #5 hyperlipidemia continue statin #6 hypothyroidism continue Synthroid. #7 hypertension patient is on lisinopril, metoprolol   Discharge Condition: stable.   Follow UP  Follow-up Information    Follow up In 1 week.      Follow up with WHITE, [place], FNP In 1 week.   Specialty:  Nurse Practitioner   Contact information:   [place] [address] [phone]       Follow up with Elisabeth Cara, NP On 01/08/2015.   Specialty:  Nurse Practitioner   Why:  at 1015   Contact information:   [place] [address] [phone]         Discharge Instructions  and  Discharge Medications       Medication List    STOP taking these medications        ciprofloxacin 500 MG tablet  Commonly known as:  CIPRO     sulfamethoxazole-trimethoprim 800-160 MG tablet  Commonly known as:  BACTRIM DS,SEPTRA DS     traMADol 50 MG tablet  Commonly known as:  ULTRAM      TAKE these medications        albuterol 108 (90 BASE) MCG/ACT inhaler  Commonly known as:  PROVENTIL HFA;VENTOLIN HFA  Inhale 2 puffs into the lungs every 6 (six) hours as needed for wheezing or shortness of breath.     amoxicillin-clavulanate 875-125 MG tablet  Commonly known as:  AUGMENTIN  Take 1 tablet by mouth 2 (two) times daily.     aspirin 325 MG tablet  Take 325 mg by mouth daily.     atorvastatin 40 MG tablet  Commonly known as:  LIPITOR  Take 40 mg by mouth daily at 6 PM.     azithromycin 500 MG tablet  Commonly known as:  ZITHROMAX  Take 1 tablet (500 mg total) by mouth daily.     clopidogrel 75 MG tablet  Commonly known as:  PLAVIX  Take 75 mg by mouth daily.     isosorbide mononitrate 30 MG 24 hr tablet  Commonly known as:  IMDUR  Take 30 mg by mouth daily.     levothyroxine 88 MCG tablet  Commonly known as:  SYNTHROID, LEVOTHROID  Take 88 mcg by mouth daily  before breakfast.     lisinopril 2.5 MG tablet  Commonly known as:  PRINIVIL,ZESTRIL  Take 2.5 mg by mouth daily.     metFORMIN 1000 MG tablet  Commonly known as:  GLUCOPHAGE  Take 1,000 mg by mouth 2 (two) times daily with a meal.     metoprolol tartrate 25 MG tablet  Commonly known as:  LOPRESSOR  Take 25 mg by mouth 2 (two) times daily.          Diet and Activity recommendation: See Discharge Instructions above   Consults obtained - none   Major procedures and Radiology Reports - PLEASE review detailed and final reports for all details, in brief -      Dg Chest Portable 1 View  12/31/2014  CLINICAL DATA:  Patient awoke 11 p.m. with difficulty breathing and nonproductive cough. Shortness of breath and wheezing. EXAM: PORTABLE CHEST 1 VIEW COMPARISON:  03/16/2005 FINDINGS: Shallow inspiration with elevation of the right hemidiaphragm. There is infiltration in the right lung base causing a obscure a shin of the right heart border. This could be due to atelectasis or pneumonia. No blunting of costophrenic angles. No pneumothorax. Left lung is clear. Normal heart size and pulmonary vascularity. Calcification of the aorta. IMPRESSION: Infiltration or atelectasis in the right lung base. Electronically Signed   By: Lucienne Capers M.D.   On: 12/31/2014 01:15    Micro Results     Recent Results (from the past 240 hour(s))  Blood culture (routine x 2)     Status: None (Preliminary result)   Collection Time: 12/31/14  2:56 AM  Result Value Ref Range Status   Specimen Description BLOOD RIGHT ANTECUBITAL  Final   Special Requests   Final    BOTTLES DRAWN AEROBIC AND ANAEROBIC AEROBIC20ML, ANAEROBIC25ML   Culture NO GROWTH 1 DAY  Final   Report Status PENDING  Incomplete  Blood culture (routine x 2)     Status: None (Preliminary result)   Collection Time: 12/31/14  2:57 AM  Result Value Ref Range Status   Specimen Description BLOOD LEFT ANTECUBITAL  Final   Special Requests  BOTTLES DRAWN AEROBIC AND ANAEROBIC 10ML  Final   Culture NO GROWTH 1 DAY  Final   Report Status PENDING  Incomplete       Today   Subjective:   Kathleen [name] today has no headache,no chest abdominal pain,no new weakness tingling or numbness, feels much better wants to go home today.   Objective:   Blood pressure 119/48, pulse 73, temperature 98.2 F (36.8 C), temperature source Oral, resp. rate 16, height 5\' 3"  (1.6 m), weight 73.664 kg (162 lb 6.4 oz), SpO2 96 %.   Intake/Output Summary (Last 24 hours) at 01/01/15 1313 Last data filed at 01/01/15 0951  Gross per 24 hour  Intake 2630.33 ml  Output      0 ml  Net 2630.33 ml  Exam Awake Alert, Oriented x 3, No new F.N deficits, Normal affect [place].AT,PERRAL Supple Neck,No JVD, No cervical lymphadenopathy appriciated.  Symmetrical Chest wall movement, Good air movement bilaterally, CTAB RRR,No Gallops,Rubs or new Murmurs, No Parasternal Heave +ve B.Sounds, Abd Soft, Non tender, No organomegaly appriciated, No rebound -guarding or rigidity. No Cyanosis, Clubbing or edema, No new Rash or bruise  Data Review   CBC w Diff: Lab Results  Component Value Date   WBC 11.6* 12/31/2014   HGB 11.1* 12/31/2014   HCT 34.4* 12/31/2014   PLT 232 12/31/2014    CMP: Lab Results  Component Value Date   NA 141 12/31/2014   K 3.8 12/31/2014   CL 109 12/31/2014   CO2 24 12/31/2014   BUN 17 12/31/2014   CREATININE 0.96 12/31/2014   PROT 7.6 12/31/2014   ALBUMIN 4.1 12/31/2014   BILITOT 0.5 12/31/2014   ALKPHOS 145* 12/31/2014   AST 21 12/31/2014   ALT 17 12/31/2014  .   Total Time in preparing paper work, data evaluation and todays exam - 64 minutes  Marcel Sorter M.D on 01/01/2015 at 1:13 PM    Note: This dictation was prepared with Dragon dictation along with smaller phrase technology. Any transcriptional errors that result from this process are unintentional.

## 2015-01-01 NOTE — Plan of Care (Signed)
Problem: Respiratory: Goal: Respiratory status will improve Outcome: Completed/Met Date Met:  01/01/15 Admisson patient was placed on Spo2 2L. Patient weaned to  room air.

## 2015-01-05 LAB — CULTURE, BLOOD (ROUTINE X 2)
Culture: NO GROWTH
Culture: NO GROWTH

## 2015-01-14 ENCOUNTER — Other Ambulatory Visit: Payer: Self-pay | Admitting: Nurse Practitioner

## 2015-01-14 DIAGNOSIS — Z1231 Encounter for screening mammogram for malignant neoplasm of breast: Secondary | ICD-10-CM

## 2015-02-03 ENCOUNTER — Ambulatory Visit
Admission: RE | Admit: 2015-02-03 | Discharge: 2015-02-03 | Disposition: A | Discharge status: Home / Self Care | Payer: BLUE CROSS/BLUE SHIELD | Source: Ambulatory Visit | Attending: Nurse Practitioner | Admitting: Nurse Practitioner

## 2015-02-03 DIAGNOSIS — Z1231 Encounter for screening mammogram for malignant neoplasm of breast: Secondary | ICD-10-CM | POA: Insufficient documentation

## 2015-02-03 HISTORY — DX: Malignant (primary) neoplasm, unspecified: C80.1

## 2015-02-12 ENCOUNTER — Other Ambulatory Visit: Payer: Self-pay | Admitting: Nurse Practitioner

## 2015-02-12 DIAGNOSIS — Z13820 Encounter for screening for osteoporosis: Secondary | ICD-10-CM

## 2015-03-04 ENCOUNTER — Ambulatory Visit
Admission: RE | Admit: 2015-03-04 | Discharge: 2015-03-04 | Disposition: A | Discharge status: Home / Self Care | Payer: BLUE CROSS/BLUE SHIELD | Source: Ambulatory Visit | Attending: Nurse Practitioner | Admitting: Nurse Practitioner

## 2015-03-04 DIAGNOSIS — Z13820 Encounter for screening for osteoporosis: Secondary | ICD-10-CM | POA: Diagnosis not present

## 2016-03-02 ENCOUNTER — Other Ambulatory Visit: Payer: Self-pay | Admitting: Nurse Practitioner

## 2016-03-02 DIAGNOSIS — Z1231 Encounter for screening mammogram for malignant neoplasm of breast: Secondary | ICD-10-CM

## 2016-03-29 ENCOUNTER — Ambulatory Visit
Admission: RE | Admit: 2016-03-29 | Discharge: 2016-03-29 | Disposition: A | Discharge status: Home / Self Care | Payer: BLUE CROSS/BLUE SHIELD | Source: Ambulatory Visit | Attending: Nurse Practitioner | Admitting: Nurse Practitioner

## 2016-03-29 DIAGNOSIS — Z1231 Encounter for screening mammogram for malignant neoplasm of breast: Secondary | ICD-10-CM | POA: Diagnosis present

## 2017-09-20 ENCOUNTER — Other Ambulatory Visit: Payer: Self-pay | Admitting: Nurse Practitioner

## 2017-09-20 DIAGNOSIS — Z1231 Encounter for screening mammogram for malignant neoplasm of breast: Secondary | ICD-10-CM

## 2017-10-18 ENCOUNTER — Ambulatory Visit (INDEPENDENT_AMBULATORY_CARE_PROVIDER_SITE_OTHER): Payer: BLUE CROSS/BLUE SHIELD

## 2017-10-18 ENCOUNTER — Ambulatory Visit
Admission: EM | Admit: 2017-10-18 | Discharge: 2017-10-18 | Disposition: A | Discharge status: Home / Self Care | Payer: BLUE CROSS/BLUE SHIELD | Attending: Emergency Medicine | Admitting: Emergency Medicine

## 2017-10-18 ENCOUNTER — Other Ambulatory Visit: Payer: Self-pay

## 2017-10-18 DIAGNOSIS — S2242XA Multiple fractures of ribs, left side, initial encounter for closed fracture: Secondary | ICD-10-CM

## 2017-10-18 MED ORDER — OXYCODONE-ACETAMINOPHEN 5-325 MG PO TABS: 1.0000 | ORAL_TABLET | Freq: Four times a day (QID) | ORAL | 0 refills | Status: DC | PRN

## 2017-10-18 NOTE — Discharge Instructions (Addendum)
Take medication as prescribed. Rest. Drink plenty of fluids.  Take deep breaths every hour as discussed to assist with lung expansion.  Avoid strenuous activity.  Follow up with your primary care physician this week as needed. Return to Urgent care for new or worsening concerns.

## 2017-10-18 NOTE — ED Triage Notes (Signed)
Patient complains of mid to lower back pain on left side that occurred when she fell while on a boat on Saturday night. Patient states that she fell against a cooler. Pain has remained constant.
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
## 2017-10-24 ENCOUNTER — Ambulatory Visit
Admission: RE | Admit: 2017-10-24 | Discharge: 2017-10-24 | Disposition: A | Discharge status: Home / Self Care | Payer: BLUE CROSS/BLUE SHIELD | Source: Ambulatory Visit | Attending: Nurse Practitioner | Admitting: Nurse Practitioner

## 2017-10-24 DIAGNOSIS — Z1231 Encounter for screening mammogram for malignant neoplasm of breast: Secondary | ICD-10-CM

## 2018-01-10 ENCOUNTER — Other Ambulatory Visit: Payer: Self-pay | Admitting: Nurse Practitioner

## 2018-01-10 DIAGNOSIS — Z13820 Encounter for screening for osteoporosis: Secondary | ICD-10-CM

## 2018-03-21 ENCOUNTER — Ambulatory Visit
Admission: RE | Admit: 2018-03-21 | Discharge: 2018-03-21 | Disposition: A | Discharge status: Home / Self Care | Payer: BLUE CROSS/BLUE SHIELD | Source: Ambulatory Visit | Attending: Nurse Practitioner | Admitting: Nurse Practitioner

## 2018-03-21 DIAGNOSIS — Z13820 Encounter for screening for osteoporosis: Secondary | ICD-10-CM | POA: Insufficient documentation

## 2018-05-21 ENCOUNTER — Encounter: Admission: RE | Payer: Self-pay | Source: Home / Self Care

## 2018-05-21 ENCOUNTER — Ambulatory Visit
Admission: RE | Admit: 2018-05-21 | Payer: BLUE CROSS/BLUE SHIELD | Source: Home / Self Care | Admitting: Gastroenterology

## 2018-05-21 SURGERY — COLONOSCOPY WITH PROPOFOL
Anesthesia: General

## 2018-11-21 ENCOUNTER — Other Ambulatory Visit: Payer: Self-pay | Admitting: Family Medicine

## 2018-11-21 DIAGNOSIS — Z1231 Encounter for screening mammogram for malignant neoplasm of breast: Secondary | ICD-10-CM

## 2019-01-07 ENCOUNTER — Ambulatory Visit
Admission: RE | Admit: 2019-01-07 | Discharge: 2019-01-07 | Disposition: A | Discharge status: Home / Self Care | Payer: BC Managed Care – PPO | Source: Ambulatory Visit | Attending: Family Medicine | Admitting: Family Medicine

## 2019-01-07 DIAGNOSIS — Z1231 Encounter for screening mammogram for malignant neoplasm of breast: Secondary | ICD-10-CM | POA: Diagnosis present

## 2019-07-30 ENCOUNTER — Ambulatory Visit
Admission: EM | Admit: 2019-07-30 | Discharge: 2019-07-30 | Disposition: A | Discharge status: Home / Self Care | Payer: BC Managed Care – PPO | Attending: Family Medicine | Admitting: Family Medicine

## 2019-07-30 ENCOUNTER — Encounter: Payer: Self-pay | Admitting: Emergency Medicine

## 2019-07-30 ENCOUNTER — Other Ambulatory Visit: Payer: Self-pay

## 2019-07-30 DIAGNOSIS — H60501 Unspecified acute noninfective otitis externa, right ear: Secondary | ICD-10-CM | POA: Diagnosis not present

## 2019-07-30 MED ORDER — NEOMYCIN-POLYMYXIN-HC 3.5-10000-1 OT SUSP: 4.0000 [drp] | Freq: Three times a day (TID) | OTIC | 0 refills | Status: AC

## 2019-07-30 NOTE — ED Provider Notes (Signed)
[facility]    CSN: [id number] Arrival date & time: 07/30/19  1845      History   Chief Complaint Chief Complaint  Patient presents with  . Otalgia   HPI  69 year old female presents with right ear pain.  Right ear pain started yesterday.  She reports difficulty hearing and also reports right ear drainage.  Denies pain at this time.  No relieving factors.  No known inciting factor.  No other associated symptoms.  No other complaints.  Past Medical History:  Diagnosis Date  . CAD (coronary artery disease)   . Cancer ([place])    skin   . HLD (hyperlipidemia)   . HTN (hypertension)   . Hypothyroidism   . Type 2 diabetes mellitus [facility])     Patient Active Problem List   Diagnosis Date Noted  . CAP (community acquired pneumonia) 12/31/2014    Past Surgical History:  Procedure Laterality Date  . CHOLECYSTECTOMY    . VAGINAL HYSTERECTOMY      OB History   No obstetric history on file.      Home Medications    Prior to Admission medications   Medication Sig Start Date End Date Taking? Authorizing Provider  aspirin 325 MG tablet Take 325 mg by mouth daily.   Yes [provider]  atorvastatin (LIPITOR) 40 MG tablet Take 40 mg by mouth daily at 6 PM.   Yes [provider]  Cholecalciferol (VITAMIN D3) 3000 units TABS Take by mouth.   Yes [provider]  glipiZIDE (GLUCOTROL XL) 10 MG 24 hr tablet Take 10 mg by mouth daily with breakfast.   Yes [provider]  levothyroxine (SYNTHROID, LEVOTHROID) 88 MCG tablet Take 88 mcg by mouth daily before breakfast.   Yes [provider]  lisinopril (PRINIVIL,ZESTRIL) 2.5 MG tablet Take 2.5 mg by mouth daily.   Yes [provider]  metoprolol tartrate (LOPRESSOR) 25 MG tablet Take 25 mg by mouth 2 (two) times daily.   Yes [provider]  traZODone (DESYREL) 50 MG tablet TK 1 T PO HS 10/15/17  Yes [provider]  vitamin B-12 (CYANOCOBALAMIN) 1000  MCG tablet Take 1,000 mcg by mouth daily.   Yes [provider]  neomycin-polymyxin-hydrocortisone (CORTISPORIN) 3.5-10000-1 OTIC suspension Place 4 drops into the right ear 3 (three) times daily for 7 days. 07/30/19 08/06/19  Coral Spikes, DO  isosorbide mononitrate (IMDUR) 30 MG 24 hr tablet Take 30 mg by mouth daily.  07/30/19  [provider]    Family History Family History  Problem Relation Age of Onset  . Heart failure Father   . Mental illness Mother   . Breast cancer Neg Hx     Social History Social History   Tobacco Use  . Smoking status: Former Research scientist (life sciences)  . Smokeless tobacco: Never Used  Substance Use Topics  . Alcohol use: No    Alcohol/week: 0.0 standard drinks  . Drug use: No     Allergies   Patient has no known allergies.   Review of Systems Review of Systems  Constitutional: Negative.   HENT: Positive for ear discharge and ear pain.    Physical Exam Triage Vital Signs ED Triage Vitals  Enc Vitals Group     BP 07/30/19 1906 (!) 174/83     Pulse Rate 07/30/19 1906 (!) 57     Resp 07/30/19 1906 18     Temp 07/30/19 1906 98.1 F (36.7 C)     Temp Source 07/30/19  1906 Oral     SpO2 07/30/19 1906 99 %     Weight 07/30/19 1904 155 lb (70.3 kg)     Height 07/30/19 1904 5\' 3"  (1.6 m)     Head Circumference --      Peak Flow --      Pain Score 07/30/19 1904 0     Pain Loc --      Pain Edu? --      Excl. in [place]? --    Updated Vital Signs BP (!) 174/83 (BP Location: Right Arm)   Pulse (!) 57   Temp 98.1 F (36.7 C) (Oral)   Resp 18   Ht 5\' 3"  (1.6 m)   Wt 70.3 kg   SpO2 99%   BMI 27.46 kg/m   Visual Acuity Right Eye Distance:   Left Eye Distance:   Bilateral Distance:    Right Eye Near:   Left Eye Near:    Bilateral Near:     Physical Exam Vitals and nursing note reviewed.  Constitutional:      General: She is not in acute distress.    Appearance: Normal appearance. She is not ill-appearing.  HENT:     Ears:     Comments:  Right ear with apparent discharge noted externally.  Canal with debris. Eyes:     General:        Right eye: No discharge.        Left eye: No discharge.     Conjunctiva/sclera: Conjunctivae normal.  Pulmonary:     Effort: Pulmonary effort is normal. No respiratory distress.  Neurological:     Mental Status: She is alert.  Psychiatric:        Mood and Affect: Mood normal.        Behavior: Behavior normal.    UC Treatments / Results  Labs (all labs ordered are listed, but only abnormal results are displayed) Labs Reviewed - No data to display  EKG   Radiology No results found.  Procedures Procedures (including critical care time)  Medications Ordered in UC Medications - No data to display  Initial Impression / Assessment and Plan / UC Course  I have reviewed the triage vital signs and the nursing notes.  Pertinent labs & imaging results that were available during my care of the patient were reviewed by me and considered in my medical decision making (see chart for details).    69 year old female presents with otitis externa.  Treating with Cortisporin otic.  Final Clinical Impressions(s) / UC Diagnoses   Final diagnoses:  Acute otitis externa of right ear, unspecified type   Discharge Instructions   None    ED Prescriptions    Medication Sig Dispense Auth. Provider   neomycin-polymyxin-hydrocortisone (CORTISPORIN) 3.5-10000-1 OTIC suspension Place 4 drops into the right ear 3 (three) times daily for 7 days. 10 mL Coral Spikes, DO     PDMP not reviewed this encounter.   Coral Spikes, [place] 07/30/19 1959

## 2019-07-30 NOTE — ED Triage Notes (Signed)
Patient c/o right ear pain and a muffled sound that has been going on since yesterday.

## 2019-09-09 DIAGNOSIS — Z01818 Encounter for other preprocedural examination: Secondary | ICD-10-CM | POA: Diagnosis not present

## 2019-09-11 DIAGNOSIS — K635 Polyp of colon: Secondary | ICD-10-CM | POA: Diagnosis not present

## 2019-09-11 DIAGNOSIS — E119 Type 2 diabetes mellitus without complications: Secondary | ICD-10-CM | POA: Diagnosis not present

## 2019-09-11 DIAGNOSIS — D126 Benign neoplasm of colon, unspecified: Secondary | ICD-10-CM | POA: Diagnosis not present

## 2019-09-11 DIAGNOSIS — D123 Benign neoplasm of transverse colon: Secondary | ICD-10-CM | POA: Diagnosis not present

## 2019-09-11 DIAGNOSIS — I10 Essential (primary) hypertension: Secondary | ICD-10-CM | POA: Diagnosis not present

## 2019-09-11 DIAGNOSIS — D125 Benign neoplasm of sigmoid colon: Secondary | ICD-10-CM | POA: Diagnosis not present

## 2019-09-11 DIAGNOSIS — Z1211 Encounter for screening for malignant neoplasm of colon: Secondary | ICD-10-CM | POA: Diagnosis not present

## 2019-10-11 DIAGNOSIS — M25512 Pain in left shoulder: Secondary | ICD-10-CM | POA: Diagnosis not present

## 2019-10-11 DIAGNOSIS — Z1389 Encounter for screening for other disorder: Secondary | ICD-10-CM | POA: Diagnosis not present

## 2019-10-11 DIAGNOSIS — B373 Candidiasis of vulva and vagina: Secondary | ICD-10-CM | POA: Diagnosis not present

## 2019-12-10 ENCOUNTER — Other Ambulatory Visit: Payer: Self-pay | Admitting: Family Medicine

## 2019-12-10 DIAGNOSIS — Z1231 Encounter for screening mammogram for malignant neoplasm of breast: Secondary | ICD-10-CM

## 2020-01-08 ENCOUNTER — Ambulatory Visit
Admission: RE | Admit: 2020-01-08 | Discharge: 2020-01-08 | Disposition: A | Discharge status: Home / Self Care | Payer: BC Managed Care – PPO | Source: Ambulatory Visit | Attending: Family Medicine | Admitting: Family Medicine

## 2020-01-08 ENCOUNTER — Other Ambulatory Visit: Payer: Self-pay

## 2020-01-08 DIAGNOSIS — Z1231 Encounter for screening mammogram for malignant neoplasm of breast: Secondary | ICD-10-CM | POA: Diagnosis not present

## 2020-03-11 DIAGNOSIS — Z20822 Contact with and (suspected) exposure to covid-19: Secondary | ICD-10-CM | POA: Diagnosis not present

## 2020-05-08 DIAGNOSIS — Z1389 Encounter for screening for other disorder: Secondary | ICD-10-CM | POA: Diagnosis not present

## 2020-05-08 DIAGNOSIS — E785 Hyperlipidemia, unspecified: Secondary | ICD-10-CM | POA: Diagnosis not present

## 2020-05-08 DIAGNOSIS — E039 Hypothyroidism, unspecified: Secondary | ICD-10-CM | POA: Diagnosis not present

## 2020-05-08 DIAGNOSIS — Z0000 Encounter for general adult medical examination without abnormal findings: Secondary | ICD-10-CM | POA: Diagnosis not present

## 2020-05-08 DIAGNOSIS — I10 Essential (primary) hypertension: Secondary | ICD-10-CM | POA: Diagnosis not present

## 2020-05-08 DIAGNOSIS — E119 Type 2 diabetes mellitus without complications: Secondary | ICD-10-CM | POA: Diagnosis not present

## 2020-05-12 ENCOUNTER — Other Ambulatory Visit: Payer: Self-pay | Admitting: Family Medicine

## 2020-05-12 DIAGNOSIS — Z1231 Encounter for screening mammogram for malignant neoplasm of breast: Secondary | ICD-10-CM

## 2020-06-17 DIAGNOSIS — I10 Essential (primary) hypertension: Secondary | ICD-10-CM | POA: Diagnosis not present

## 2020-06-17 DIAGNOSIS — E119 Type 2 diabetes mellitus without complications: Secondary | ICD-10-CM | POA: Diagnosis not present

## 2020-06-19 DIAGNOSIS — R001 Bradycardia, unspecified: Secondary | ICD-10-CM | POA: Diagnosis not present

## 2020-06-19 DIAGNOSIS — I10 Essential (primary) hypertension: Secondary | ICD-10-CM | POA: Diagnosis not present

## 2020-06-19 DIAGNOSIS — R0989 Other specified symptoms and signs involving the circulatory and respiratory systems: Secondary | ICD-10-CM | POA: Diagnosis not present

## 2020-06-19 DIAGNOSIS — R0609 Other forms of dyspnea: Secondary | ICD-10-CM | POA: Diagnosis not present

## 2020-06-19 DIAGNOSIS — E785 Hyperlipidemia, unspecified: Secondary | ICD-10-CM | POA: Diagnosis not present

## 2020-06-19 DIAGNOSIS — I25118 Atherosclerotic heart disease of native coronary artery with other forms of angina pectoris: Secondary | ICD-10-CM | POA: Diagnosis not present

## 2020-06-19 DIAGNOSIS — E119 Type 2 diabetes mellitus without complications: Secondary | ICD-10-CM | POA: Diagnosis not present

## 2020-06-19 DIAGNOSIS — E039 Hypothyroidism, unspecified: Secondary | ICD-10-CM | POA: Diagnosis not present

## 2020-08-12 DIAGNOSIS — I6523 Occlusion and stenosis of bilateral carotid arteries: Secondary | ICD-10-CM | POA: Diagnosis not present

## 2020-08-12 DIAGNOSIS — R0989 Other specified symptoms and signs involving the circulatory and respiratory systems: Secondary | ICD-10-CM | POA: Diagnosis not present

## 2020-08-12 DIAGNOSIS — R0609 Other forms of dyspnea: Secondary | ICD-10-CM | POA: Diagnosis not present

## 2020-08-12 DIAGNOSIS — I771 Stricture of artery: Secondary | ICD-10-CM | POA: Diagnosis not present

## 2020-08-12 DIAGNOSIS — I6502 Occlusion and stenosis of left vertebral artery: Secondary | ICD-10-CM | POA: Diagnosis not present

## 2020-08-12 DIAGNOSIS — I25118 Atherosclerotic heart disease of native coronary artery with other forms of angina pectoris: Secondary | ICD-10-CM | POA: Diagnosis not present

## 2020-08-18 DIAGNOSIS — I2510 Atherosclerotic heart disease of native coronary artery without angina pectoris: Secondary | ICD-10-CM | POA: Diagnosis not present

## 2020-08-18 DIAGNOSIS — R0609 Other forms of dyspnea: Secondary | ICD-10-CM | POA: Diagnosis not present

## 2020-08-18 DIAGNOSIS — I6523 Occlusion and stenosis of bilateral carotid arteries: Secondary | ICD-10-CM | POA: Diagnosis not present

## 2020-08-18 DIAGNOSIS — R001 Bradycardia, unspecified: Secondary | ICD-10-CM | POA: Diagnosis not present

## 2020-10-12 ENCOUNTER — Ambulatory Visit (INDEPENDENT_AMBULATORY_CARE_PROVIDER_SITE_OTHER): Payer: BC Managed Care – PPO | Admitting: Nurse Practitioner

## 2020-10-12 ENCOUNTER — Encounter (INDEPENDENT_AMBULATORY_CARE_PROVIDER_SITE_OTHER): Payer: Self-pay | Admitting: Nurse Practitioner

## 2020-10-12 ENCOUNTER — Other Ambulatory Visit: Payer: Self-pay

## 2020-10-12 VITALS — BP 171/70 | HR 57 | Resp 17 | Ht 63.0 in | Wt 145.0 lb

## 2020-10-12 DIAGNOSIS — E119 Type 2 diabetes mellitus without complications: Secondary | ICD-10-CM | POA: Diagnosis not present

## 2020-10-12 DIAGNOSIS — I6523 Occlusion and stenosis of bilateral carotid arteries: Secondary | ICD-10-CM | POA: Diagnosis not present

## 2020-10-12 DIAGNOSIS — E782 Mixed hyperlipidemia: Secondary | ICD-10-CM | POA: Diagnosis not present

## 2020-10-12 DIAGNOSIS — I10 Essential (primary) hypertension: Secondary | ICD-10-CM

## 2020-10-12 NOTE — Progress Notes (Signed)
Subjective:    Patient ID: Kathleen [name], female    DOB: 04-02-1950, 70 y.o.   MRN: [id number] Chief Complaint  Patient presents with   New Patient (Initial Visit)    Carotid artery stenosis    Kathleen [name] is a 70 year old female that presents today after referral from Ms. White, NP.  Initially the patient presented to her cardiologist office after having an episode of dizziness after she became very hot while mowing her yard.  During evaluation at her cardiologist office it was noted that she had a left carotid bruit.  Noninvasive studies that follow show that the patient had a 70 to 99% stenosis of the right ICA with 50 to 69% stenosis in the left.  There is also concern for a developing left subclavian artery stenosis.  Currently the patient denies any TIA-like symptoms.  She denies any amaurosis fugax.  She denies any previous CVA.   Review of Systems  Eyes:  Negative for visual disturbance.  Neurological:  Positive for dizziness.  All other systems reviewed and are negative.     Objective:   Physical Exam Vitals reviewed.  HENT:     Head: Normocephalic.  Neck:     Vascular: Carotid bruit present.  Cardiovascular:     Rate and Rhythm: Normal rate.  Pulmonary:     Effort: Pulmonary effort is normal.  Skin:    General: Skin is warm and dry.  Neurological:     Mental Status: She is alert and oriented to person, place, and time.  Psychiatric:        Mood and Affect: Mood normal.        Behavior: Behavior normal.        Thought Content: Thought content normal.        Judgment: Judgment normal.    BP (!) 171/70 (BP Location: Right Arm)   Pulse (!) 57   Resp 17   Ht '5\' 3"'$  (1.6 m)   Wt 145 lb (65.8 kg)   BMI 25.69 kg/m   Past Medical History:  Diagnosis Date   CAD (coronary artery disease)    Cancer ([facility])    skin    HLD (hyperlipidemia)    HTN (hypertension)    Hypothyroidism    Type 2 diabetes mellitus ([place])     Social History   Socioeconomic History    Marital status: Married    Spouse name: Not on file   Number of children: Not on file   Years of education: Not on file   Highest education level: Not on file  Occupational History   Not on file  Tobacco Use   Smoking status: Former   Smokeless tobacco: Never  Vaping Use   Vaping Use: Never used  Substance and Sexual Activity   Alcohol use: No    Alcohol/week: 0.0 standard drinks   Drug use: No   Sexual activity: Not on file  Other Topics Concern   Not on file  Social History Narrative   Not on file   Social Determinants of Health   Financial Resource Strain: Not on file  Food Insecurity: Not on file  Transportation Needs: Not on file  Physical Activity: Not on file  Stress: Not on file  Social Connections: Not on file  Intimate Partner Violence: Not on file    Past Surgical History:  Procedure Laterality Date   CHOLECYSTECTOMY     VAGINAL HYSTERECTOMY      Family History  Problem Relation Age of Onset  Heart failure Father    Mental illness Mother    Breast cancer Neg Hx     No Known Allergies  CBC Latest Ref Rng & Units 12/31/2014  WBC 3.6 - 11.0 K/uL 11.6(H)  Hemoglobin 12.0 - 16.0 g/dL 11.1(L)  Hematocrit 35.0 - 47.0 % 34.4(L)  Platelets 150 - 440 K/uL 232      CMP     Component Value Date/Time   NA 141 12/31/2014 0058   K 3.8 12/31/2014 0058   CL 109 12/31/2014 0058   CO2 24 12/31/2014 0058   GLUCOSE 151 (H) 12/31/2014 0058   BUN 17 12/31/2014 0058   CREATININE 0.96 12/31/2014 0058   CALCIUM 9.9 12/31/2014 0058   PROT 7.6 12/31/2014 0058   ALBUMIN 4.1 12/31/2014 0058   AST 21 12/31/2014 0058   ALT 17 12/31/2014 0058   ALKPHOS 145 (H) 12/31/2014 0058   BILITOT 0.5 12/31/2014 0058   GFRNONAA >60 12/31/2014 0058   GFRAA >60 12/31/2014 0058     No results found.     Assessment & Plan:   1. Bilateral carotid artery stenosis The patient remains asymptomatic with respect to the carotid stenosis.  However, the patient has now  progressed and has a lesion the is >70%.  Patient should undergo CT angiography of the carotid arteries to define the degree of stenosis of the internal carotid arteries bilaterally and the anatomic suitability for surgery vs. intervention.  If the patient does indeed need surgery cardiac clearance will be required, once cleared the patient will be scheduled for surgery.  The risks, benefits and alternative therapies were reviewed in detail with the patient.  All questions were answered.  The patient agrees to proceed with imaging.  Continue antiplatelet therapy as prescribed. Continue management of CAD, HTN and Hyperlipidemia. Healthy heart diet, encouraged exercise at least 4 times per week.   - CT ANGIO NECK W OR WO CONTRAST; Future - Creatinine, IStat  2. Essential hypertension Continue antihypertensive medications as already ordered, these medications have been reviewed and there are no changes at this time.   3. Mixed hyperlipidemia Continue statin as ordered and reviewed, no changes at this time   4. Non-insulin dependent type 2 diabetes mellitus ([place]) Continue hypoglycemic medications as already ordered, these medications have been reviewed and there are no changes at this time.  Hgb A1C to be monitored as already arranged by primary service    Current Outpatient Medications on File Prior to Visit  Medication Sig Dispense Refill   albuterol (VENTOLIN HFA) 108 (90 Base) MCG/ACT inhaler SMARTSIG:2 Puff(s) By Mouth Every 4-6 Hours PRN     aspirin 325 MG tablet Take 325 mg by mouth daily.     atorvastatin (LIPITOR) 40 MG tablet Take 40 mg by mouth daily at 6 PM.     Calcium Carbonate-Vitamin D 600-200 MG-UNIT TABS Take by mouth.     Cholecalciferol (VITAMIN D3) 3000 units TABS Take by mouth.     empagliflozin (JARDIANCE) 10 MG TABS tablet TAKE 1 TABLET BY MOUTH EVERY DAY FOR DIABETES     glipiZIDE (GLUCOTROL XL) 10 MG 24 hr tablet Take 10 mg by mouth daily with breakfast.      levothyroxine (SYNTHROID, LEVOTHROID) 88 MCG tablet Take 88 mcg by mouth daily before breakfast.     linagliptin (TRADJENTA) 5 MG TABS tablet Take by mouth.     lisinopril (PRINIVIL,ZESTRIL) 2.5 MG tablet Take 2.5 mg by mouth daily.     metoprolol tartrate (LOPRESSOR) 25 MG  tablet Take 25 mg by mouth 2 (two) times daily.     traZODone (DESYREL) 50 MG tablet TK 1 T PO HS  1   vitamin B-12 (CYANOCOBALAMIN) 1000 MCG tablet Take 1,000 mcg by mouth daily.     [DISCONTINUED] isosorbide mononitrate (IMDUR) 30 MG 24 hr tablet Take 30 mg by mouth daily.     No current facility-administered medications on file prior to visit.    There are no Patient Instructions on file for this visit. No follow-ups on file.   Kris Hartmann, NP

## 2020-11-06 ENCOUNTER — Other Ambulatory Visit (INDEPENDENT_AMBULATORY_CARE_PROVIDER_SITE_OTHER): Payer: Self-pay | Admitting: Nurse Practitioner

## 2020-11-20 ENCOUNTER — Other Ambulatory Visit: Payer: Self-pay

## 2020-11-20 ENCOUNTER — Ambulatory Visit
Admission: RE | Admit: 2020-11-20 | Discharge: 2020-11-20 | Disposition: A | Discharge status: Home / Self Care | Payer: BC Managed Care – PPO | Source: Ambulatory Visit | Attending: Nurse Practitioner | Admitting: Nurse Practitioner

## 2020-11-20 DIAGNOSIS — I771 Stricture of artery: Secondary | ICD-10-CM | POA: Diagnosis not present

## 2020-11-20 DIAGNOSIS — I6523 Occlusion and stenosis of bilateral carotid arteries: Secondary | ICD-10-CM | POA: Diagnosis not present

## 2020-11-20 DIAGNOSIS — M47812 Spondylosis without myelopathy or radiculopathy, cervical region: Secondary | ICD-10-CM | POA: Diagnosis not present

## 2020-11-20 DIAGNOSIS — I6503 Occlusion and stenosis of bilateral vertebral arteries: Secondary | ICD-10-CM | POA: Diagnosis not present

## 2020-11-20 LAB — POCT I-STAT CREATININE: Creatinine, Ser: 0.9 mg/dL (ref 0.44–1.00)

## 2020-11-20 MED ORDER — IOHEXOL 350 MG/ML SOLN
75.0000 mL | Freq: Once | INTRAVENOUS | Status: AC | PRN
  Administered 2020-11-20: 75 mL via INTRAVENOUS

## 2020-11-22 NOTE — Progress Notes (Signed)
MRN : [id number]  Kathleen [name] is a 70 y.o. (1950/06/05) female who presents with chief complaint of follow up after CT.  History of Present Illness:   The patient is seen for evaluation of carotid stenosis status post CT angiogram. CT scan was done 11/20/2020. Patient reports that the test went well with no problems or complications.   The patient describes right eye amaurosis fugax. There is no recent or interval TIA symptoms or focal motor deficits. There is no prior documented CVA.  The patient is taking enteric-coated aspirin 81 mg daily.  There is no history of migraine headaches. There is no history of seizures.  The patient has a history of coronary artery disease, no recent episodes of angina or shortness of breath.  History of coronary stent placement.  The patient denies PAD or claudication symptoms. There is a history of hyperlipidemia which is being treated with a statin.    CT angiogram is reviewed by me personally with the patient and shows 80-90% stenosis consistent with calcified plaque at the origin of the right internal carotid artery.  The left ICA appears to have a 60-70% stenosis.  This is markedly different from the radiology report.  Therefore I have ordered a repeat carotid duplex today in an [place] lab which confirms 80-99% Right ICA    No outpatient medications have been marked as taking for the 11/23/20 encounter (Appointment) with Delana Meyer, Dolores Lory, MD.    Past Medical History:  Diagnosis Date   CAD (coronary artery disease)    Cancer ([place])    skin    HLD (hyperlipidemia)    HTN (hypertension)    Hypothyroidism    Type 2 diabetes mellitus ([place])     Past Surgical History:  Procedure Laterality Date   CHOLECYSTECTOMY     VAGINAL HYSTERECTOMY      Social History Social History   Tobacco Use   Smoking status: Former   Smokeless tobacco: Never  Scientific laboratory technician Use: Never used  Substance Use Topics   Alcohol use: No    Alcohol/week:  0.0 standard drinks   Drug use: No    Family History Family History  Problem Relation Age of Onset   Heart failure Father    Mental illness Mother    Breast cancer Neg Hx     No Known Allergies   REVIEW OF SYSTEMS (Negative unless checked)  Constitutional: [] Weight loss  [] Fever  [] Chills Cardiac: [] Chest pain   [] Chest pressure   [] Palpitations   [] Shortness of breath when laying flat   [] Shortness of breath with exertion. Vascular:  [] Pain in legs with walking   [] Pain in legs at rest  [] History of DVT   [] Phlebitis   [] Swelling in legs   [] Varicose veins   [] Non-healing ulcers Pulmonary:   [] Uses home oxygen   [] Productive cough   [] Hemoptysis   [] Wheeze  [] COPD   [] Asthma Neurologic:  [] Dizziness   [] Seizures   [] History of stroke   [] History of TIA  [] Aphasia   [] Vissual changes   [] Weakness or numbness in arm   [] Weakness or numbness in leg Musculoskeletal:   [] Joint swelling   [] Joint pain   [] Low back pain Hematologic:  [] Easy bruising  [] Easy bleeding   [] Hypercoagulable state   [] Anemic Gastrointestinal:  [] Diarrhea   [] Vomiting  [] Gastroesophageal reflux/heartburn   [] Difficulty swallowing. Genitourinary:  [] Chronic kidney disease   [] Difficult urination  [] Frequent urination   [] Blood in urine Skin:  [] Rashes   []   Ulcers  Psychological:  [] History of anxiety   []  History of major depression.  Physical Examination  There were no vitals filed for this visit. There is no height or weight on file to calculate BMI. Gen: WD/WN, NAD Head: [place]/AT, No temporalis wasting.  Ear/Nose/Throat: Hearing grossly intact, nares w/o erythema or drainage Eyes: PER, EOMI, sclera nonicteric.  Neck: Supple, no masses.  No bruit or JVD.  Pulmonary:  Good air movement, no audible wheezing, no use of accessory muscles.  Cardiac: RRR, normal S1, S2, no Murmurs. Vascular:   bilateral carotid bruits grade 5/6 Vessel Right Left  Radial Palpable Palpable  Carotid Palpable Palpable   Gastrointestinal: soft, non-distended. No guarding/no peritoneal signs.  Musculoskeletal: M/S 5/5 throughout.  No visible deformity.  Neurologic: CN 2-12 intact. Pain and light touch intact in extremities.  Symmetrical.  Speech is fluent. Motor exam as listed above. Psychiatric: Judgment intact, Mood & affect appropriate for pt's clinical situation. Dermatologic: No rashes or ulcers noted.  No changes consistent with cellulitis.   CBC Lab Results  Component Value Date   WBC 11.6 (H) 12/31/2014   HGB 11.1 (L) 12/31/2014   HCT 34.4 (L) 12/31/2014   MCV 83.3 12/31/2014   PLT 232 12/31/2014    BMET    Component Value Date/Time   NA 141 12/31/2014 0058   K 3.8 12/31/2014 0058   CL 109 12/31/2014 0058   CO2 24 12/31/2014 0058   GLUCOSE 151 (H) 12/31/2014 0058   BUN 17 12/31/2014 0058   CREATININE 0.90 11/20/2020 1117   CALCIUM 9.9 12/31/2014 0058   GFRNONAA >60 12/31/2014 0058   GFRAA >60 12/31/2014 0058   CrCl cannot be calculated (Unknown ideal weight.).  COAG No results found for: INR, PROTIME  Radiology CT ANGIO NECK W OR WO CONTRAST  Result Date: 11/21/2020 CLINICAL DATA:  Carotid artery stenosis EXAM: CT ANGIOGRAPHY NECK TECHNIQUE: Multidetector CT imaging of the neck was performed using the standard protocol during bolus administration of intravenous contrast. Multiplanar CT image reconstructions and MIPs were obtained to evaluate the vascular anatomy. Carotid stenosis measurements (when applicable) are obtained utilizing NASCET criteria, using the distal internal carotid diameter as the denominator. CONTRAST:  39mL OMNIPAQUE IOHEXOL 350 MG/ML SOLN COMPARISON:  None. FINDINGS: Aortic arch: Approximately 70% stenosis of the proximal left subclavian artery. Otherwise, mild atherosclerotic narrowing of the great vessel origins. Right carotid system: Mixed calcific and noncalcific atherosclerosis at the carotid bifurcation with proximally 50% stenosis of the proximal ICA  relative to the distal vessel. Left carotid system: Atherosclerosis at the carotid bifurcation with approximately 20% stenosis of the proximal ICA relative to the distal vessel. Vertebral arteries: Mildly right dominant. Patent bilaterally. Right vertebral artery is patent without significant stenosis. Mild stenosis of the left vertebral artery due to mass effect from adjacent osteophyte at the C4 C5 level. Moderate stenosis of the proximal left intradural vertebral artery. Skeleton: Multilevel degenerative change with left greater than right facet and uncovertebral hypertrophy, severe in the upper cervical spine. Other neck: No acute findings.  Small heterogeneous thyroid. Upper chest: Biapical pleuroparenchymal scarring. Bronchial wall thickening. No consolidation in the visualized lung apices. Venous: There is filling defect within the right greater than left internal jugular veins, favored to reflect mixing artifact given this occurs at the site where a prominent feeding vein joins the IJ and given bilaterality. IMPRESSION: 1. Bilateral carotid bifurcation atherosclerosis with approximately 50% right and 20% left proximal ICA stenosis. 2. Approximately 70% stenosis of the proximal left subclavian  artery. 3. Moderate stenosis of the proximal left intradural vertebral artery. 4. Severe left upper cervical facet hypertrophy. MRI could better evaluate for foraminal stenosis if clinically indicated. Electronically Signed   By: Margaretha Sheffield M.D.   On: 11/21/2020 09:21     Assessment/Plan 1. 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  The patient has  a lesion that is 85%.  Patient's CT angiography of the carotid arteries confirms >80% right ICA stenosis.  The anatomical considerations support stenting over surgery.  This was discussed in detail with the patient.  The risks, benefits and alternative  therapies were reviewed in detail with the patient.  All questions were answered.  The patient agrees to proceed with stenting of the right carotid artery.  Continue antiplatelet therapy as prescribed. Continue management of CAD, HTN and Hyperlipidemia. Healthy heart diet, encouraged exercise at least 4 times per week.    2. Essential hypertension Continue antihypertensive medications as already ordered, these medications have been reviewed and there are no changes at this time.   3. Type 2 diabetes mellitus without complication, unspecified whether long term insulin use ([place]) Continue hypoglycemic medications as already ordered, these medications have been reviewed and there are no changes at this time.  Hgb A1C to be monitored as already arranged by primary service   4. Mixed hyperlipidemia Continue statin as ordered and reviewed, no changes at this time     Hortencia Pilar, MD  11/22/2020 2:07 PM

## 2020-11-23 ENCOUNTER — Ambulatory Visit (INDEPENDENT_AMBULATORY_CARE_PROVIDER_SITE_OTHER): Payer: BC Managed Care – PPO | Admitting: Vascular Surgery

## 2020-11-23 ENCOUNTER — Other Ambulatory Visit (INDEPENDENT_AMBULATORY_CARE_PROVIDER_SITE_OTHER): Payer: Self-pay | Admitting: Vascular Surgery

## 2020-11-23 ENCOUNTER — Other Ambulatory Visit: Payer: Self-pay

## 2020-11-23 ENCOUNTER — Ambulatory Visit (INDEPENDENT_AMBULATORY_CARE_PROVIDER_SITE_OTHER): Payer: BC Managed Care – PPO

## 2020-11-23 ENCOUNTER — Encounter (INDEPENDENT_AMBULATORY_CARE_PROVIDER_SITE_OTHER): Payer: Self-pay | Admitting: Vascular Surgery

## 2020-11-23 DIAGNOSIS — I2510 Atherosclerotic heart disease of native coronary artery without angina pectoris: Secondary | ICD-10-CM | POA: Diagnosis not present

## 2020-11-23 DIAGNOSIS — I6529 Occlusion and stenosis of unspecified carotid artery: Secondary | ICD-10-CM | POA: Insufficient documentation

## 2020-11-23 DIAGNOSIS — I10 Essential (primary) hypertension: Secondary | ICD-10-CM | POA: Diagnosis not present

## 2020-11-23 DIAGNOSIS — E119 Type 2 diabetes mellitus without complications: Secondary | ICD-10-CM

## 2020-11-23 DIAGNOSIS — I6523 Occlusion and stenosis of bilateral carotid arteries: Secondary | ICD-10-CM | POA: Diagnosis not present

## 2020-11-23 DIAGNOSIS — R001 Bradycardia, unspecified: Secondary | ICD-10-CM | POA: Diagnosis not present

## 2020-11-23 DIAGNOSIS — R0609 Other forms of dyspnea: Secondary | ICD-10-CM | POA: Diagnosis not present

## 2020-11-23 DIAGNOSIS — I6521 Occlusion and stenosis of right carotid artery: Secondary | ICD-10-CM | POA: Diagnosis not present

## 2020-11-23 DIAGNOSIS — E785 Hyperlipidemia, unspecified: Secondary | ICD-10-CM | POA: Insufficient documentation

## 2020-11-23 DIAGNOSIS — E782 Mixed hyperlipidemia: Secondary | ICD-10-CM

## 2020-11-23 DIAGNOSIS — E118 Type 2 diabetes mellitus with unspecified complications: Secondary | ICD-10-CM | POA: Insufficient documentation

## 2020-11-23 DIAGNOSIS — J449 Chronic obstructive pulmonary disease, unspecified: Secondary | ICD-10-CM | POA: Insufficient documentation

## 2020-12-04 ENCOUNTER — Telehealth (INDEPENDENT_AMBULATORY_CARE_PROVIDER_SITE_OTHER): Payer: Self-pay

## 2020-12-04 NOTE — Telephone Encounter (Signed)
Patient left voicemail checking the status of prescription. I have called in Clopidogrel 75 mg #30 take 1 tablet by mouth daily with 11 refills has been called into pharmacy. Patient is made aware

## 2020-12-14 ENCOUNTER — Telehealth (INDEPENDENT_AMBULATORY_CARE_PROVIDER_SITE_OTHER): Payer: Self-pay

## 2020-12-14 NOTE — Telephone Encounter (Signed)
Reach out to the patient to schedule right carotid stent procedure and message was left a patient voicemail to return call to the office.

## 2020-12-25 ENCOUNTER — Telehealth (INDEPENDENT_AMBULATORY_CARE_PROVIDER_SITE_OTHER): Payer: Self-pay

## 2020-12-25 ENCOUNTER — Other Ambulatory Visit (INDEPENDENT_AMBULATORY_CARE_PROVIDER_SITE_OTHER): Payer: Self-pay | Admitting: Nurse Practitioner

## 2020-12-25 MED ORDER — CLOPIDOGREL BISULFATE 75 MG PO TABS: 75.0000 mg | ORAL_TABLET | Freq: Every day | ORAL | 11 refills | Status: DC

## 2020-12-25 NOTE — Telephone Encounter (Signed)
I attempted to contact the patient and a message was left for a return call to schedule a right carotid stent placement with Dr. Delana Meyer.

## 2020-12-25 NOTE — Telephone Encounter (Signed)
Patient returned my call and is now scheduled with Dr. Delana Meyer for a right carotid stent placement on 12/29/20 with a 9:30 am arrival time to the [facility]. Patient will do a covid test on 12/28/20 between 8-12 at the [facility]. Pre-procedure instructions were discussed and patient stated she wrote them down.

## 2020-12-28 ENCOUNTER — Other Ambulatory Visit: Payer: Self-pay

## 2020-12-28 ENCOUNTER — Other Ambulatory Visit
Admission: RE | Admit: 2020-12-28 | Discharge: 2020-12-28 | Disposition: A | Discharge status: Home / Self Care | Payer: BC Managed Care – PPO | Source: Ambulatory Visit | Attending: Vascular Surgery | Admitting: Vascular Surgery

## 2020-12-28 DIAGNOSIS — I10 Essential (primary) hypertension: Secondary | ICD-10-CM | POA: Diagnosis not present

## 2020-12-28 DIAGNOSIS — E119 Type 2 diabetes mellitus without complications: Secondary | ICD-10-CM | POA: Diagnosis not present

## 2020-12-28 DIAGNOSIS — I6521 Occlusion and stenosis of right carotid artery: Secondary | ICD-10-CM | POA: Diagnosis not present

## 2020-12-28 DIAGNOSIS — H53121 Transient visual loss, right eye: Secondary | ICD-10-CM | POA: Diagnosis not present

## 2020-12-28 DIAGNOSIS — Z85828 Personal history of other malignant neoplasm of skin: Secondary | ICD-10-CM | POA: Diagnosis not present

## 2020-12-28 DIAGNOSIS — Z79890 Hormone replacement therapy (postmenopausal): Secondary | ICD-10-CM | POA: Diagnosis not present

## 2020-12-28 DIAGNOSIS — E782 Mixed hyperlipidemia: Secondary | ICD-10-CM | POA: Diagnosis not present

## 2020-12-28 DIAGNOSIS — Z7902 Long term (current) use of antithrombotics/antiplatelets: Secondary | ICD-10-CM | POA: Diagnosis not present

## 2020-12-28 DIAGNOSIS — I2510 Atherosclerotic heart disease of native coronary artery without angina pectoris: Secondary | ICD-10-CM | POA: Diagnosis not present

## 2020-12-28 DIAGNOSIS — Z8249 Family history of ischemic heart disease and other diseases of the circulatory system: Secondary | ICD-10-CM | POA: Diagnosis not present

## 2020-12-28 DIAGNOSIS — Z87891 Personal history of nicotine dependence: Secondary | ICD-10-CM | POA: Diagnosis not present

## 2020-12-28 DIAGNOSIS — Z90710 Acquired absence of both cervix and uterus: Secondary | ICD-10-CM | POA: Diagnosis not present

## 2020-12-28 DIAGNOSIS — Z7984 Long term (current) use of oral hypoglycemic drugs: Secondary | ICD-10-CM | POA: Diagnosis not present

## 2020-12-28 DIAGNOSIS — Z20822 Contact with and (suspected) exposure to covid-19: Secondary | ICD-10-CM | POA: Diagnosis not present

## 2020-12-28 DIAGNOSIS — Z01812 Encounter for preprocedural laboratory examination: Secondary | ICD-10-CM | POA: Insufficient documentation

## 2020-12-28 DIAGNOSIS — Z79899 Other long term (current) drug therapy: Secondary | ICD-10-CM | POA: Diagnosis not present

## 2020-12-28 DIAGNOSIS — Z7982 Long term (current) use of aspirin: Secondary | ICD-10-CM | POA: Diagnosis not present

## 2020-12-28 DIAGNOSIS — Z818 Family history of other mental and behavioral disorders: Secondary | ICD-10-CM | POA: Diagnosis not present

## 2020-12-28 DIAGNOSIS — E039 Hypothyroidism, unspecified: Secondary | ICD-10-CM | POA: Diagnosis not present

## 2020-12-28 DIAGNOSIS — Z955 Presence of coronary angioplasty implant and graft: Secondary | ICD-10-CM | POA: Diagnosis not present

## 2020-12-29 ENCOUNTER — Other Ambulatory Visit (INDEPENDENT_AMBULATORY_CARE_PROVIDER_SITE_OTHER): Payer: Self-pay | Admitting: Nurse Practitioner

## 2020-12-29 ENCOUNTER — Encounter
Admission: RE | Disposition: A | Discharge status: Home / Self Care | Payer: Self-pay | Source: Home / Self Care | Attending: Vascular Surgery

## 2020-12-29 ENCOUNTER — Other Ambulatory Visit: Payer: Self-pay

## 2020-12-29 ENCOUNTER — Encounter: Payer: Self-pay | Admitting: Vascular Surgery

## 2020-12-29 ENCOUNTER — Inpatient Hospital Stay
Admission: RE | Admit: 2020-12-29 | Discharge: 2020-12-30 | DRG: 035 | Disposition: A | Discharge status: Home / Self Care | Payer: BC Managed Care – PPO | Attending: Vascular Surgery | Admitting: Vascular Surgery

## 2020-12-29 DIAGNOSIS — E782 Mixed hyperlipidemia: Secondary | ICD-10-CM | POA: Diagnosis present

## 2020-12-29 DIAGNOSIS — I6521 Occlusion and stenosis of right carotid artery: Secondary | ICD-10-CM | POA: Diagnosis present

## 2020-12-29 DIAGNOSIS — Z85828 Personal history of other malignant neoplasm of skin: Secondary | ICD-10-CM

## 2020-12-29 DIAGNOSIS — Z20822 Contact with and (suspected) exposure to covid-19: Secondary | ICD-10-CM | POA: Diagnosis present

## 2020-12-29 DIAGNOSIS — Z818 Family history of other mental and behavioral disorders: Secondary | ICD-10-CM

## 2020-12-29 DIAGNOSIS — Z79899 Other long term (current) drug therapy: Secondary | ICD-10-CM

## 2020-12-29 DIAGNOSIS — H53121 Transient visual loss, right eye: Secondary | ICD-10-CM | POA: Diagnosis present

## 2020-12-29 DIAGNOSIS — Z87891 Personal history of nicotine dependence: Secondary | ICD-10-CM | POA: Diagnosis not present

## 2020-12-29 DIAGNOSIS — I2510 Atherosclerotic heart disease of native coronary artery without angina pectoris: Secondary | ICD-10-CM | POA: Diagnosis present

## 2020-12-29 DIAGNOSIS — I10 Essential (primary) hypertension: Secondary | ICD-10-CM | POA: Diagnosis present

## 2020-12-29 DIAGNOSIS — Z7982 Long term (current) use of aspirin: Secondary | ICD-10-CM

## 2020-12-29 DIAGNOSIS — Z79890 Hormone replacement therapy (postmenopausal): Secondary | ICD-10-CM

## 2020-12-29 DIAGNOSIS — Z8249 Family history of ischemic heart disease and other diseases of the circulatory system: Secondary | ICD-10-CM | POA: Diagnosis not present

## 2020-12-29 DIAGNOSIS — E119 Type 2 diabetes mellitus without complications: Secondary | ICD-10-CM | POA: Diagnosis present

## 2020-12-29 DIAGNOSIS — Z7902 Long term (current) use of antithrombotics/antiplatelets: Secondary | ICD-10-CM | POA: Diagnosis not present

## 2020-12-29 DIAGNOSIS — E039 Hypothyroidism, unspecified: Secondary | ICD-10-CM | POA: Diagnosis present

## 2020-12-29 DIAGNOSIS — Z90710 Acquired absence of both cervix and uterus: Secondary | ICD-10-CM

## 2020-12-29 DIAGNOSIS — Z7984 Long term (current) use of oral hypoglycemic drugs: Secondary | ICD-10-CM

## 2020-12-29 DIAGNOSIS — Z955 Presence of coronary angioplasty implant and graft: Secondary | ICD-10-CM

## 2020-12-29 HISTORY — PX: CAROTID PTA/STENT INTERVENTION: CATH118231

## 2020-12-29 LAB — GLUCOSE, CAPILLARY
Glucose-Capillary: 140 mg/dL — ABNORMAL HIGH (ref 70–99)
Glucose-Capillary: 74 mg/dL (ref 70–99)
Glucose-Capillary: 85 mg/dL (ref 70–99)

## 2020-12-29 LAB — HEMOGLOBIN A1C
Hgb A1c MFr Bld: 7.1 % — ABNORMAL HIGH (ref 4.8–5.6)
Mean Plasma Glucose: 157.07 mg/dL

## 2020-12-29 LAB — BUN: BUN: 22 mg/dL (ref 8–23)

## 2020-12-29 LAB — CREATININE, SERUM
Creatinine, Ser: 0.95 mg/dL (ref 0.44–1.00)
GFR, Estimated: >60 mL/min (ref >60)

## 2020-12-29 LAB — SARS CORONAVIRUS 2 (TAT 6-24 HRS): SARS Coronavirus 2: NEGATIVE

## 2020-12-29 SURGERY — CAROTID PTA/STENT INTERVENTION
Anesthesia: Moderate Sedation

## 2020-12-29 MED ORDER — FENTANYL CITRATE PF 50 MCG/ML IJ SOSY
PREFILLED_SYRINGE | INTRAMUSCULAR | Status: AC
  Filled 2020-12-29: qty 1

## 2020-12-29 MED ORDER — SODIUM CHLORIDE 0.9 % IV SOLN
500.0000 mL | Freq: Once | INTRAVENOUS | Status: AC | PRN
  Administered 2020-12-29: 500 mL via INTRAVENOUS

## 2020-12-29 MED ORDER — LISINOPRIL 2.5 MG PO TABS
2.5000 mg | ORAL_TABLET | Freq: Every day | ORAL | Status: DC
  Administered 2020-12-29: 2.5 mg via ORAL
  Filled 2020-12-29 (×2): qty 1

## 2020-12-29 MED ORDER — HYDRALAZINE HCL 20 MG/ML IJ SOLN: 5.0000 mg | INTRAMUSCULAR | Status: DC | PRN

## 2020-12-29 MED ORDER — MIDAZOLAM HCL 2 MG/2ML IJ SOLN
INTRAMUSCULAR | Status: AC
  Filled 2020-12-29: qty 2

## 2020-12-29 MED ORDER — SODIUM CHLORIDE 0.9 % IV SOLN
INTRAVENOUS | Status: AC | PRN
  Administered 2020-12-29: 999 mL/h via INTRAVENOUS

## 2020-12-29 MED ORDER — ACETAMINOPHEN 325 MG PO TABS: 325.0000 mg | ORAL_TABLET | ORAL | Status: DC | PRN

## 2020-12-29 MED ORDER — FENTANYL CITRATE (PF) 100 MCG/2ML IJ SOLN
INTRAMUSCULAR | Status: DC | PRN
  Administered 2020-12-29: 25 ug via INTRAVENOUS
  Administered 2020-12-29: 50 ug via INTRAVENOUS

## 2020-12-29 MED ORDER — PHENYLEPHRINE HCL-NACL 20-0.9 MG/250ML-% IV SOLN
INTRAVENOUS | Status: AC
  Filled 2020-12-29: qty 250

## 2020-12-29 MED ORDER — PHENYLEPHRINE 40 MCG/ML (10ML) SYRINGE FOR IV PUSH (FOR BLOOD PRESSURE SUPPORT)
PREFILLED_SYRINGE | INTRAVENOUS | Status: AC
  Filled 2020-12-29: qty 10

## 2020-12-29 MED ORDER — CLOPIDOGREL BISULFATE 75 MG PO TABS
75.0000 mg | ORAL_TABLET | Freq: Every day | ORAL | Status: DC
  Administered 2020-12-29 – 2020-12-30 (×2): 75 mg via ORAL
  Filled 2020-12-29 (×2): qty 1

## 2020-12-29 MED ORDER — LEVOTHYROXINE SODIUM 75 MCG PO TABS
75.0000 ug | ORAL_TABLET | Freq: Every day | ORAL | Status: DC
  Administered 2020-12-30: 75 ug via ORAL
  Filled 2020-12-29 (×3): qty 1

## 2020-12-29 MED ORDER — FAMOTIDINE 20 MG PO TABS: 40.0000 mg | ORAL_TABLET | Freq: Once | ORAL | Status: DC | PRN

## 2020-12-29 MED ORDER — ASPIRIN EC 81 MG PO TBEC
81.0000 mg | DELAYED_RELEASE_TABLET | Freq: Every day | ORAL | Status: DC
  Administered 2020-12-29 – 2020-12-30 (×2): 81 mg via ORAL
  Filled 2020-12-29 (×2): qty 1

## 2020-12-29 MED ORDER — ALUM & MAG HYDROXIDE-SIMETH 200-200-20 MG/5ML PO SUSP: 15.0000 mL | ORAL | Status: DC | PRN

## 2020-12-29 MED ORDER — METHYLPREDNISOLONE SODIUM SUCC 125 MG IJ SOLR: 125.0000 mg | Freq: Once | INTRAMUSCULAR | Status: DC | PRN

## 2020-12-29 MED ORDER — SODIUM CHLORIDE 0.9 % IV SOLN: INTRAVENOUS | Status: DC

## 2020-12-29 MED ORDER — INSULIN ASPART 100 UNIT/ML IJ SOLN
0.0000 [IU] | Freq: Three times a day (TID) | INTRAMUSCULAR | Status: DC
  Administered 2020-12-30: 2 [IU] via SUBCUTANEOUS
  Filled 2020-12-29: qty 1

## 2020-12-29 MED ORDER — MIDAZOLAM HCL 2 MG/ML PO SYRP: 8.0000 mg | ORAL_SOLUTION | Freq: Once | ORAL | Status: DC | PRN

## 2020-12-29 MED ORDER — HYDROMORPHONE HCL 1 MG/ML IJ SOLN
1.0000 mg | Freq: Once | INTRAMUSCULAR | Status: DC | PRN
Start: 2020-12-29 — End: 2020-12-29

## 2020-12-29 MED ORDER — ISOSORBIDE MONONITRATE ER 30 MG PO TB24
30.0000 mg | ORAL_TABLET | Freq: Every day | ORAL | Status: DC
  Administered 2020-12-29 – 2020-12-30 (×2): 30 mg via ORAL
  Filled 2020-12-29 (×2): qty 1

## 2020-12-29 MED ORDER — ONDANSETRON HCL 4 MG/2ML IJ SOLN: 4.0000 mg | Freq: Four times a day (QID) | INTRAMUSCULAR | Status: DC | PRN

## 2020-12-29 MED ORDER — PANTOPRAZOLE SODIUM 40 MG IV SOLR
40.0000 mg | Freq: Every day | INTRAVENOUS | Status: DC
  Administered 2020-12-29: 40 mg via INTRAVENOUS
  Filled 2020-12-29: qty 40

## 2020-12-29 MED ORDER — VITAMIN B-12 1000 MCG PO TABS
1000.0000 ug | ORAL_TABLET | Freq: Every day | ORAL | Status: DC
  Administered 2020-12-29 – 2020-12-30 (×2): 1000 ug via ORAL
  Filled 2020-12-29 (×2): qty 1

## 2020-12-29 MED ORDER — ALBUTEROL SULFATE (2.5 MG/3ML) 0.083% IN NEBU: 3.0000 mL | INHALATION_SOLUTION | RESPIRATORY_TRACT | Status: DC | PRN

## 2020-12-29 MED ORDER — OYSTER SHELL CALCIUM/D 500-5 MG-MCG PO TABS
1.0000 | ORAL_TABLET | Freq: Every day | ORAL | Status: DC
  Administered 2020-12-29: 1 via ORAL
  Filled 2020-12-29 (×2): qty 1

## 2020-12-29 MED ORDER — PHENYLEPHRINE HCL (PRESSORS) 10 MG/ML IV SOLN
INTRAVENOUS | Status: AC
  Filled 2020-12-29: qty 1

## 2020-12-29 MED ORDER — HEPARIN SODIUM (PORCINE) 1000 UNIT/ML IJ SOLN
INTRAMUSCULAR | Status: AC
  Filled 2020-12-29: qty 1

## 2020-12-29 MED ORDER — ATORVASTATIN CALCIUM 20 MG PO TABS
40.0000 mg | ORAL_TABLET | Freq: Every day | ORAL | Status: DC
  Administered 2020-12-29: 40 mg via ORAL
  Filled 2020-12-29: qty 2

## 2020-12-29 MED ORDER — METOPROLOL TARTRATE 5 MG/5ML IV SOLN
2.0000 mg | INTRAVENOUS | Status: DC | PRN
Start: 2020-12-29 — End: 2020-12-30

## 2020-12-29 MED ORDER — CEFAZOLIN SODIUM-DEXTROSE 2-4 GM/100ML-% IV SOLN
2.0000 g | Freq: Three times a day (TID) | INTRAVENOUS | Status: AC
  Administered 2020-12-29 – 2020-12-30 (×2): 2 g via INTRAVENOUS
  Filled 2020-12-29 (×2): qty 100

## 2020-12-29 MED ORDER — KCL IN DEXTROSE-NACL 20-5-0.9 MEQ/L-%-% IV SOLN
INTRAVENOUS | Status: AC
  Filled 2020-12-29 (×2): qty 1000

## 2020-12-29 MED ORDER — OXYCODONE-ACETAMINOPHEN 5-325 MG PO TABS: 1.0000 | ORAL_TABLET | ORAL | Status: DC | PRN

## 2020-12-29 MED ORDER — GUAIFENESIN-DM 100-10 MG/5ML PO SYRP: 15.0000 mL | ORAL_SOLUTION | ORAL | Status: DC | PRN

## 2020-12-29 MED ORDER — ATROPINE SULFATE 1 MG/10ML IJ SOSY
PREFILLED_SYRINGE | INTRAMUSCULAR | Status: DC | PRN
  Administered 2020-12-29: 1 mg via INTRAVENOUS

## 2020-12-29 MED ORDER — DOCUSATE SODIUM 100 MG PO CAPS
100.0000 mg | ORAL_CAPSULE | Freq: Every day | ORAL | Status: DC
  Administered 2020-12-30: 100 mg via ORAL
  Filled 2020-12-29: qty 1

## 2020-12-29 MED ORDER — PHENOL 1.4 % MT LIQD
1.0000 | OROMUCOSAL | Status: DC | PRN
  Filled 2020-12-29: qty 177

## 2020-12-29 MED ORDER — HEPARIN SODIUM (PORCINE) 1000 UNIT/ML IJ SOLN
INTRAMUSCULAR | Status: DC | PRN
  Administered 2020-12-29: 7000 [IU] via INTRAVENOUS
  Administered 2020-12-29: 1000 [IU] via INTRAVENOUS

## 2020-12-29 MED ORDER — MORPHINE SULFATE (PF) 4 MG/ML IV SOLN: 2.0000 mg | INTRAVENOUS | Status: DC | PRN

## 2020-12-29 MED ORDER — DIPHENHYDRAMINE HCL 50 MG/ML IJ SOLN: 50.0000 mg | Freq: Once | INTRAMUSCULAR | Status: DC | PRN

## 2020-12-29 MED ORDER — CEFAZOLIN SODIUM-DEXTROSE 2-4 GM/100ML-% IV SOLN
2.0000 g | Freq: Once | INTRAVENOUS | Status: AC
  Administered 2020-12-29: 2 g via INTRAVENOUS

## 2020-12-29 MED ORDER — ACETAMINOPHEN 650 MG RE SUPP: 325.0000 mg | RECTAL | Status: DC | PRN

## 2020-12-29 MED ORDER — ATROPINE SULFATE 1 MG/10ML IJ SOSY
PREFILLED_SYRINGE | INTRAMUSCULAR | Status: AC
  Filled 2020-12-29: qty 10

## 2020-12-29 MED ORDER — TRAZODONE HCL 50 MG PO TABS: 50.0000 mg | ORAL_TABLET | Freq: Every evening | ORAL | Status: DC | PRN

## 2020-12-29 MED ORDER — MIDAZOLAM HCL 2 MG/2ML IJ SOLN
INTRAMUSCULAR | Status: DC | PRN
  Administered 2020-12-29: .5 mg via INTRAVENOUS
  Administered 2020-12-29: 2 mg via INTRAVENOUS

## 2020-12-29 MED ORDER — LABETALOL HCL 5 MG/ML IV SOLN: 10.0000 mg | INTRAVENOUS | Status: DC | PRN

## 2020-12-29 SURGICAL SUPPLY — 21 items
BALLN VTRAC 4.5X20X135 (BALLOONS) ×2
BALLOON VTRAC 4.5X20X135 (BALLOONS) ×1 IMPLANT
CATH ANGIO 5F PIGTAIL 100CM (CATHETERS) ×2 IMPLANT
CATH BEACON 5 .035 100 H1 TIP (CATHETERS) ×2 IMPLANT
COVER DRAPE FLUORO 36X44 (DRAPES) ×2 IMPLANT
DEVICE EMBOSHIELD NAV6 4.0-7.0 (FILTER) ×2 IMPLANT
DEVICE STARCLOSE SE CLOSURE (Vascular Products) ×2 IMPLANT
DEVICE TORQUE .025-.038 (MISCELLANEOUS) ×2 IMPLANT
GLIDEWIRE ANGLED SS 035X260CM (WIRE) ×2 IMPLANT
GUIDEWIRE VASC STIFF .038X260 (WIRE) ×2 IMPLANT
KIT CAROTID MANIFOLD (MISCELLANEOUS) ×2 IMPLANT
KIT ENCORE 26 ADVANTAGE (KITS) ×2 IMPLANT
NEEDLE ENTRY 21GA 7CM ECHOTIP (NEEDLE) ×2 IMPLANT
PACK ANGIOGRAPHY (CUSTOM PROCEDURE TRAY) ×2 IMPLANT
SET INTRO CAPELLA COAXIAL (SET/KITS/TRAYS/PACK) ×4 IMPLANT
SHEATH BRITE TIP 5FRX11 (SHEATH) ×2 IMPLANT
SHEATH NEURON MAX 6FR 80CM (SHEATH) ×2 IMPLANT
STENT XACT CAR 9-7X30X136 (Permanent Stent) ×2 IMPLANT
SYR MEDRAD MARK 7 150ML (SYRINGE) ×2 IMPLANT
TUBING CONTRAST HIGH PRESS 72 (TUBING) ×2 IMPLANT
WIRE GUIDERIGHT .035X150 (WIRE) ×2 IMPLANT

## 2020-12-29 NOTE — Interval H&P Note (Signed)
History and Physical Interval Note:  12/29/2020 11:53 AM  Kathleen [name]  has presented today for surgery, with the diagnosis of RT Carotid Stent    RT Artery Carotid Stenosis   ABBOTT Rep  cc: Judi Cong.  The various methods of treatment have been discussed with the patient and family. After consideration of risks, benefits and other options for treatment, the patient has consented to  Procedure(s): CAROTID PTA/STENT INTERVENTION (N/A) as a surgical intervention.  The patient's history has been reviewed, patient examined, no change in status, stable for surgery.  I have reviewed the patient's chart and labs.  Questions were answered to the patient's satisfaction.     Hortencia Pilar

## 2020-12-29 NOTE — Progress Notes (Signed)
Pt. BG:74. Pt. Asymptomatic. Pt. Given 2 juices (apple) now. Spoke with pt. Daughter Kathleen [name] on phone and given status update.

## 2020-12-29 NOTE — H&P (Signed)
[facility]   MRN : [id number]  Kathleen [name] is a 70 y.o. (1950/04/21) female who presents with chief complaint of here for stent in my carotid.  History of Present Illness:  Patient presents to [facility] today for angiography with the intention for intervention of the right internal carotid artery.  The patient status post CT angiogram. CT scan was done 11/20/2020. Patient reports that the test went well with no problems or complications.    The patient described right eye amaurosis fugax. There is no recent or interval TIA symptoms or focal motor deficits. There is no prior documented CVA.   The patient is taking enteric-coated aspirin 81 mg daily.   There is no history of migraine headaches. There is no history of seizures.   The patient has a history of coronary artery disease, no recent episodes of angina or shortness of breath.  History of coronary stent placement.   The patient denies PAD or claudication symptoms. There is a history of hyperlipidemia which is being treated with a statin.     CT angiogram is reviewed by me personally with the patient and shows 80-90% stenosis consistent with calcified plaque at the origin of the right internal carotid artery.  The left ICA appears to have a 60-70% stenosis.  This is markedly different from the radiology report.    Therefore I ordered a repeat carotid duplex in an [facility] lab which confirmed 80-99% Right ICA   Current Meds  Medication Sig   albuterol (VENTOLIN HFA) 108 (90 Base) MCG/ACT inhaler Inhale 2 puffs into the lungs every 4 (four) hours as needed for wheezing or shortness of breath.   aspirin 81 MG EC tablet Take 81 mg by mouth daily.   atorvastatin (LIPITOR) 40 MG tablet Take 40 mg by mouth daily at 6 PM.   Calcium Carbonate-Vitamin D 600-200 MG-UNIT TABS Take 1 tablet by mouth in the morning and at bedtime.   clopidogrel (PLAVIX) 75 MG tablet Take 1 tablet (75 mg total) by  mouth daily.   empagliflozin (JARDIANCE) 25 MG TABS tablet Take 25 mg by mouth daily.   glipiZIDE (GLUCOTROL XL) 5 MG 24 hr tablet Take 5 mg by mouth daily with breakfast.   isosorbide mononitrate (IMDUR) 30 MG 24 hr tablet Take 30 mg by mouth daily.   levothyroxine (SYNTHROID) 75 MCG tablet Take 75 mcg by mouth daily before breakfast.   linagliptin (TRADJENTA) 5 MG TABS tablet Take 5 mg by mouth daily.   lisinopril (PRINIVIL,ZESTRIL) 2.5 MG tablet Take 2.5 mg by mouth daily.   metoprolol tartrate (LOPRESSOR) 25 MG tablet Take 25 mg by mouth 2 (two) times daily.   traZODone (DESYREL) 50 MG tablet Take 50 mg by mouth at bedtime as needed for sleep.   vitamin B-12 (CYANOCOBALAMIN) 1000 MCG tablet Take 1,000 mcg by mouth daily.    Past Medical History:  Diagnosis Date   CAD (coronary artery disease)    Cancer ([place])    skin    HLD (hyperlipidemia)    HTN (hypertension)    Hypothyroidism    Type 2 diabetes mellitus ([place])     Past Surgical History:  Procedure Laterality Date   CHOLECYSTECTOMY     VAGINAL HYSTERECTOMY      Social History Social History   Tobacco Use   Smoking status: Former   Smokeless tobacco: Never  Scientific laboratory technician Use: Never used  Substance Use Topics   Alcohol use: No  Alcohol/week: 0.0 standard drinks   Drug use: No    Family History Family History  Problem Relation Age of Onset   Heart failure Father    Mental illness Mother    Breast cancer Neg Hx     No Known Allergies   REVIEW OF SYSTEMS (Negative unless checked)  Constitutional: [] Weight loss  [] Fever  [] Chills Cardiac: [] Chest pain   [] Chest pressure   [] Palpitations   [] Shortness of breath when laying flat   [] Shortness of breath with exertion. Vascular:  [] Pain in legs with walking   [] Pain in legs at rest  [] History of DVT   [] Phlebitis   [] Swelling in legs   [] Varicose veins   [] Non-healing ulcers Pulmonary:   [] Uses home oxygen   [] Productive cough   [] Hemoptysis   [] Wheeze   [] COPD   [] Asthma Neurologic:  [] Dizziness   [] Seizures   [] History of stroke   [x] History of TIA  [] Aphasia   [] Vissual changes   [] Weakness or numbness in arm   [] Weakness or numbness in leg Musculoskeletal:   [] Joint swelling   [] Joint pain   [] Low back pain Hematologic:  [] Easy bruising  [] Easy bleeding   [] Hypercoagulable state   [] Anemic Gastrointestinal:  [] Diarrhea   [] Vomiting  [] Gastroesophageal reflux/heartburn   [] Difficulty swallowing. Genitourinary:  [] Chronic kidney disease   [] Difficult urination  [] Frequent urination   [] Blood in urine Skin:  [] Rashes   [] Ulcers  Psychological:  [] History of anxiety   []  History of major depression.  Physical Examination  Vitals:   12/29/20 0947  Pulse: 63  Resp: 20  Temp: (!) 97.5 F (36.4 C)  TempSrc: Oral  SpO2: 99%  Weight: 65.8 kg  Height: 5\' 3"  (1.6 m)   Body mass index is 25.69 kg/m. Gen: WD/WN, NAD Head: [place]/AT, No temporalis wasting.  Ear/Nose/Throat: Hearing grossly intact, nares w/o erythema or drainage Eyes: PER, EOMI, sclera nonicteric.  Neck: Supple, no masses.  No bruit or JVD.  Pulmonary:  Good air movement, no audible wheezing, no use of accessory muscles.  Cardiac: RRR, normal S1, S2, no Murmurs. Vascular:  bilateral carotid bruits Vessel Right Left  Radial Palpable Palpable  Carotid Palpable Palpable  Gastrointestinal: soft, non-distended. No guarding/no peritoneal signs.  Musculoskeletal: M/S 5/5 throughout.  No visible deformity.  Neurologic: CN 2-12 intact. Pain and light touch intact in extremities.  Symmetrical.  Speech is fluent. Motor exam as listed above. Psychiatric: Judgment intact, Mood & affect appropriate for pt's clinical situation. Dermatologic: No rashes or ulcers noted.  No changes consistent with cellulitis.   CBC Lab Results  Component Value Date   WBC 11.6 (H) 12/31/2014   HGB 11.1 (L) 12/31/2014   HCT 34.4 (L) 12/31/2014   MCV 83.3 12/31/2014   PLT 232 12/31/2014    BMET     Component Value Date/Time   NA 141 12/31/2014 0058   K 3.8 12/31/2014 0058   CL 109 12/31/2014 0058   CO2 24 12/31/2014 0058   GLUCOSE 151 (H) 12/31/2014 0058   BUN 22 12/29/2020 0959   CREATININE 0.95 12/29/2020 0959   CALCIUM 9.9 12/31/2014 0058   GFRNONAA >60 12/29/2020 0959   GFRAA >60 12/31/2014 0058   Estimated Creatinine Clearance: 51 mL/min (by C-G formula based on SCr of 0.95 mg/dL).  COAG No results found for: INR, PROTIME  Radiology No results found.   Assessment/Plan 1. 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  The patient has  a lesion that is 85%.   Patient's CT angiography of the carotid arteries confirms >80% right ICA stenosis.  The anatomical considerations support stenting over surgery.  This was discussed in detail with the patient.   The risks, benefits and alternative therapies were reviewed in detail with the patient.  All questions were answered.  The patient agrees to proceed with stenting of the right carotid artery.   Continue antiplatelet therapy as prescribed. Continue management of CAD, HTN and Hyperlipidemia. Healthy heart diet, encouraged exercise at least 4 times per week.     2. Essential hypertension Continue antihypertensive medications as already ordered, these medications have been reviewed and there are no changes at this time.    3. Type 2 diabetes mellitus without complication, unspecified whether long term insulin use ([place]) Continue hypoglycemic medications as already ordered, these medications have been reviewed and there are no changes at this time.   Hgb A1C to be monitored as already arranged by primary service    4. Mixed hyperlipidemia Continue statin as ordered and reviewed, no changes at this time    Hortencia Pilar, MD  12/29/2020 11:49 AM

## 2020-12-29 NOTE — Op Note (Signed)
OPERATIVE NOTE DATE: 12/29/2020  PROCEDURE:  Ultrasound guidance for vascular access right femoral artery  Placement of a 9 x 7 x 30 exact stent with the use of the NAV-6 embolic protection device in the right internal carotid artery  PRE-OPERATIVE DIAGNOSIS: 1.  Greater than 80% stenosis of the right internal carotid artery. 2.  Transient visual loss right eye  POST-OPERATIVE DIAGNOSIS:  Same as above  SURGEON: Hortencia Pilar  ASSISTANT(S): None  ANESTHESIA: local/MCS  ESTIMATED BLOOD LOSS: 50 cc  CONTRAST: 75 cc  FLUORO TIME: 6.2 minutes  MODERATE CONSCIOUS SEDATION TIME: Continuous ECG pulse oximetry and cardiopulmonary monitoring was performed throughout the entire procedure by the interventional radiology nurse total sedation time was 49 minutes  FINDING(S): 1.   Greater than 80% right internal carotid artery stenosis  SPECIMEN(S):   none  INDICATIONS:   Patient is a 70 y.o. female who presents with greater than 80% right internal carotid artery stenosis.  The patient has had episodes of amaurosis fugax of the right eye in association with severe coronary artery disease and a high bifurcation and carotid artery stenting was felt to be preferred to endarterectomy for that reason.  Risks and benefits were discussed and informed consent was obtained.   DESCRIPTION: After obtaining full informed written consent, the patient was brought back to the vascular suite and placed supine upon the table.  The patient received IV antibiotics prior to induction. Moderate conscious sedation was administered during a face to face encounter with the patient throughout the procedure with my supervision of the RN administering medicines and monitoring the patients vital signs and mental status throughout from the start of the procedure until the patient was taken to the recovery room.    After obtaining adequate sedation, the patient was prepped and draped in the standard fashion.    A  first assistant is required in order to allow for a safe and more efficient operation.  Duties include wire manipulations as well as assistance with pinning the sheath and positioning the detector for proper angle, assistance and deploying the stent in the proper position and appropriate images.  Further duties include assisting with patient positioning during the procedure.  I believe that this procedure requires a first assistant in order for it to be performed at a level in keeping with the high standards of this institution.  The right femoral artery was visualized with ultrasound and found to be widely patent. It was then accessed under direct ultrasound guidance without difficulty with a micropuncture needle. A permanent image was recorded.  A microwire was then advanced without difficulty under fluoroscopic guidance followed by a micro-sheath.  A J-wire was placed and we then placed a 6 French sheath. The patient was then heparinized and a total of 8000 units of intravenous heparin were given and an ACT was checked to confirm successful anticoagulation.  (7000 units of heparin was given and the ACT was returned 289 and therefore an additional 1000 units of heparin was given)  A pigtail catheter was then placed into the ascending aorta. This showed type II aortic arch ostia of the great vessels are widely patent. The right and innominate artery was then selectively cannulated without difficulty with a H1 catheter and the catheter advanced into the mid right common carotid artery.  Cervical and cerebral carotid angiography was then performed. There were no obvious intracranial filling defects. The carotid bifurcation demonstrated greater than 80% stenosis at the origin of the right internal carotid artery  with greater than 90% stenosis at the origin of the right external carotid artery.  I then advanced the Glidewire into the external carotid artery and then the H1 catheter.  I then exchanged the Glidewire  for the Amplatz Super Stiff wire. Over the Amplatz Super Stiff wire, a 6 French penumbra sheath was placed into the mid common carotid artery. I then used the NAV-6  Embolic protection device and crossed the lesion and parked this in the distal internal carotid artery at the base of the skull.  I then selected a 9 x 7 x 30 exact stent. This was deployed across the lesion encompassing it in its entirety. A 4.5 x 20 mm length balloon was used to post dilate the stent. Only about a 15% residual stenosis was present after angioplasty. Completion angiogram showed normal intracranial filling without new defects. At this point I elected to terminate the procedure. The sheath was removed and StarClose closure device was deployed in the right femoral artery with excellent hemostatic result. The patient was taken to the recovery room in stable condition having tolerated the procedure well.  COMPLICATIONS: none  CONDITION: stable  Hortencia Pilar 12/29/2020 1:16 PM   This note was created with Dragon Medical transcription system. Any errors in dictation are purely unintentional.

## 2020-12-30 DIAGNOSIS — I6521 Occlusion and stenosis of right carotid artery: Principal | ICD-10-CM

## 2020-12-30 LAB — BASIC METABOLIC PANEL
Anion gap: 6 (ref 5–15)
BUN: 14 mg/dL (ref 8–23)
CO2: 23 mmol/L (ref 22–32)
Calcium: 8.3 mg/dL — ABNORMAL LOW (ref 8.9–10.3)
Chloride: 112 mmol/L — ABNORMAL HIGH (ref 98–111)
Creatinine, Ser: 0.72 mg/dL (ref 0.44–1.00)
GFR, Estimated: >60 mL/min (ref >60)
Glucose, Bld: 87 mg/dL (ref 70–99)
Potassium: 4 mmol/L (ref 3.5–5.1)
Sodium: 141 mmol/L (ref 135–145)

## 2020-12-30 LAB — GLUCOSE, CAPILLARY
Glucose-Capillary: 80 mg/dL (ref 70–99)
Glucose-Capillary: 89 mg/dL (ref 70–99)
Glucose-Capillary: 147 mg/dL — ABNORMAL HIGH (ref 70–99)
Glucose-Capillary: 88 mg/dL (ref 70–99)

## 2020-12-30 LAB — CBC
HCT: 34.4 % — ABNORMAL LOW (ref 36.0–46.0)
Hemoglobin: 11.1 g/dL — ABNORMAL LOW (ref 12.0–15.0)
MCH: 29.5 pg (ref 26.0–34.0)
MCHC: 32.3 g/dL (ref 30.0–36.0)
MCV: 91.5 fL (ref 80.0–100.0)
Platelets: 135 10*3/uL — ABNORMAL LOW (ref 150–400)
RBC: 3.76 MIL/uL — ABNORMAL LOW (ref 3.87–5.11)
RDW: 13.2 % (ref 11.5–15.5)
WBC: 7.4 10*3/uL (ref 4.0–10.5)
nRBC: 0 % (ref 0.0–0.2)

## 2020-12-30 LAB — POCT ACTIVATED CLOTTING TIME: Activated Clotting Time: 289 seconds

## 2020-12-30 MED ORDER — SODIUM CHLORIDE 0.9 % IV BOLUS
500.0000 mL | Freq: Once | INTRAVENOUS | Status: AC
  Administered 2020-12-30: 500 mL via INTRAVENOUS

## 2020-12-30 MED ORDER — DOPAMINE-DEXTROSE 3.2-5 MG/ML-% IV SOLN: 0.0000 ug/kg/min | INTRAVENOUS | Status: DC

## 2020-12-30 MED ORDER — SODIUM CHLORIDE 0.9 % IV BOLUS
500.0000 mL | Freq: Once | INTRAVENOUS | Status: AC
Start: 2020-12-30 — End: 2020-12-30
  Administered 2020-12-30: 500 mL via INTRAVENOUS

## 2020-12-30 MED ORDER — CHLORHEXIDINE GLUCONATE CLOTH 2 % EX PADS
6.0000 | MEDICATED_PAD | Freq: Every day | CUTANEOUS | Status: DC
  Administered 2020-12-30: 6 via TOPICAL

## 2020-12-30 NOTE — Progress Notes (Signed)
Patient is hypotensive and asymptomatic. On-call MD notified. New orders entered by this Probation officer. Dopamine 66mcg ordered is NS bolus is ineffective. Dorsalis pedis pulses are palpable. No bleeding noted. Call bell in reach. Will continue to monitor.

## 2020-12-30 NOTE — Discharge Instructions (Signed)
Vascular Surgery Discharge Instructions:  1) You may shower as of tomorrow.  Please keep your groins clean and dry.  Gently clean with soap and water.  Gently pat dry. 2) Please do not engage in strenuous activity or lifting greater than 10 pounds until you are cleared at your first post operative follow-up. 3) Please do not drive for at least one week.

## 2020-12-30 NOTE — Progress Notes (Signed)
   12/30/20 0915  Clinical Encounter Type  Visited With Patient  Visit Type Initial;Spiritual support;Social support  Referral From Chaplain  Consult/Referral To [place] visited PT at bedside and offered emotional and spiritual support. PT spoke of her love fore her family and how they loved to travel to [place] in a RV. PT spoke of her various sisters struggling with their health recently, and "now its her turn". Chaplain normalized her emotions and used storytelling as a way of coping. PT seem to be in good spirits. Reflective listening, prayer, and peaceful presence were all elements of this visit. Pt looks forward to possibly being discharged today.

## 2020-12-30 NOTE — Discharge Summary (Signed)
[facility]    Discharge Summary  Patient ID:  Kathleen [name] MRN: [id number] DOB/AGE: 10-30-1950 71 y.o.  Admit date: 12/29/2020 Discharge date: 12/30/2020 Date of Surgery: 12/29/2020 Surgeon: Surgeon(s): Schnier, Dolores Lory, MD  Admission Diagnosis: Carotid stenosis, symptomatic w/o infarct, right [I65.21]  Discharge Diagnoses:  Carotid stenosis, symptomatic w/o infarct, right [I65.21]  Secondary Diagnoses: Past Medical History:  Diagnosis Date   CAD (coronary artery disease)    Cancer ([place])    skin    HLD (hyperlipidemia)    HTN (hypertension)    Hypothyroidism    Type 2 diabetes mellitus ([place])    Procedure(s): 12/29/20:  Ultrasound guidance for vascular access right femoral artery  Placement of a 9 x 7 x 30 exact stent with the use of the NAV-6 embolic protection device in the right internal carotid artery  Discharged Condition: Good  HPI / [name] Course:  Patient is a 70 year old female who presents with greater than 80% right internal carotid artery stenosis.  The patient has had episodes of amaurosis fugax of the right eye in association with severe coronary artery disease and a high bifurcation and carotid artery stenting was felt to be preferred to endarterectomy for that reason.  Risks and benefits were discussed and informed consent was obtained. On 12/29/20, the patient underwent:   Ultrasound guidance for vascular access right femoral artery  Placement of a 9 x 7 x 30 exact stent with the use of the NAV-6 embolic protection device in the right internal carotid artery  The patient tolerated the procedure well was transferred from the angiography suite to the ICU for observation overnight.  The patient's night of procedure was unremarkable.  During her brief stay, her diet was advanced, her she was urinating independently, her discomfort was controlled to the use of p.o. pain medication she was ambulating at baseline.  Day of discharge, the  patient was afebrile with stable vital signs and essentially unremarkable physical exam.  Physical Exam:  Alert and oriented x3, no acute distress Face: Symmetrical, tongue midline Neck: Trachea midline.  No swelling.  No ecchymosis. Cardiovascular: Regular rate and rhythm Pulmonary: Clear to auscultation bilaterally Abdomen: Soft, nontender, nondistended positive bowel sounds Extremity: Warm distally to toes, minimal edema Neurological: Intact, 5 out of 5 upper/lower  Labs: As below  Complications: None  Consults: None  Significant Diagnostic Studies: CBC Lab Results  Component Value Date   WBC 7.4 12/30/2020   HGB 11.1 (L) 12/30/2020   HCT 34.4 (L) 12/30/2020   MCV 91.5 12/30/2020   PLT 135 (L) 12/30/2020   BMET    Component Value Date/Time   NA 141 12/30/2020 0412   K 4.0 12/30/2020 0412   CL 112 (H) 12/30/2020 0412   CO2 23 12/30/2020 0412   GLUCOSE 87 12/30/2020 0412   BUN 14 12/30/2020 0412   CREATININE 0.72 12/30/2020 0412   CALCIUM 8.3 (L) 12/30/2020 0412   GFRNONAA >60 12/30/2020 0412   GFRAA >60 12/31/2014 0058   COAG No results found for: INR, PROTIME  Disposition:  Discharge to :Home  Allergies as of 12/30/2020   No Known Allergies      Medication List     TAKE these medications    albuterol 108 (90 Base) MCG/ACT inhaler Commonly known as: VENTOLIN HFA Inhale 2 puffs into the lungs every 4 (four) hours as needed for wheezing or shortness of breath.   aspirin 81 MG EC tablet Take 81 mg by mouth daily.  atorvastatin 40 MG tablet Commonly known as: LIPITOR Take 40 mg by mouth daily at 6 PM.   Calcium Carbonate-Vitamin D 600-200 MG-UNIT Tabs Take 1 tablet by mouth in the morning and at bedtime.   clopidogrel 75 MG tablet Commonly known as: PLAVIX Take 1 tablet (75 mg total) by mouth daily.   empagliflozin 25 MG Tabs tablet Commonly known as: JARDIANCE Take 25 mg by mouth daily.   glipiZIDE 5 MG 24 hr tablet Commonly known as:  GLUCOTROL XL Take 5 mg by mouth daily with breakfast.   isosorbide mononitrate 30 MG 24 hr tablet Commonly known as: IMDUR Take 30 mg by mouth daily.   levothyroxine 75 MCG tablet Commonly known as: SYNTHROID Take 75 mcg by mouth daily before breakfast.   linagliptin 5 MG Tabs tablet Commonly known as: TRADJENTA Take 5 mg by mouth daily.   lisinopril 2.5 MG tablet Commonly known as: ZESTRIL Take 2.5 mg by mouth daily.   metoprolol tartrate 25 MG tablet Commonly known as: LOPRESSOR Take 25 mg by mouth 2 (two) times daily.   traZODone 50 MG tablet Commonly known as: DESYREL Take 50 mg by mouth at bedtime as needed for sleep.   vitamin B-12 1000 MCG tablet Commonly known as: CYANOCOBALAMIN Take 1,000 mcg by mouth daily.       Verbal and written Discharge instructions given to the patient. Wound care per Discharge AVS  Follow-up Information     Schnier, Dolores Lory, MD Follow up in 1 month(s).   Specialties: Vascular Surgery, Cardiology, Radiology, Vascular Surgery Why: First post procedure follow-up.  Will need carotid duplex with visit. Contact information: [place] [address] [phone]                Signed: Sela Hua, PA-C 12/30/2020, 2:36 PM

## 2021-01-04 ENCOUNTER — Encounter (INDEPENDENT_AMBULATORY_CARE_PROVIDER_SITE_OTHER): Payer: Self-pay | Admitting: Vascular Surgery

## 2021-01-11 ENCOUNTER — Other Ambulatory Visit: Payer: Self-pay | Admitting: Family Medicine

## 2021-01-11 DIAGNOSIS — Z1231 Encounter for screening mammogram for malignant neoplasm of breast: Secondary | ICD-10-CM

## 2021-01-31 NOTE — Progress Notes (Signed)
Patient ID: Kathleen [name], female   DOB: 08/28/1950, 70 y.o.   MRN: [id number]  No chief complaint on file.   [name] is a 70 y.o. female.    Patient is s/p  placement of a 9 x 7 x 30 exact stent with the use of the NAV-6 embolic protection device in the right internal carotid artery on 12/29/2020.  Patient denies any interval neurologic symptoms or amaurosis fugax.  She states she is done very well.  She did note some bruising in the groin post but this has resolved and she has no residual groin complaints.  She is tolerating her dual antiplatelet therapy without difficulty.   Past Medical History:  Diagnosis Date   CAD (coronary artery disease)    Cancer ([place])    skin    HLD (hyperlipidemia)    HTN (hypertension)    Hypothyroidism    Type 2 diabetes mellitus ([place])     Past Surgical History:  Procedure Laterality Date   CAROTID PTA/STENT INTERVENTION N/A 12/29/2020   Procedure: CAROTID PTA/STENT INTERVENTION;  Surgeon: Katha Cabal, MD;  Location: [facility];  Service: Cardiovascular;  Laterality: N/A;   CHOLECYSTECTOMY     VAGINAL HYSTERECTOMY        No Known Allergies  Current Outpatient Medications  Medication Sig Dispense Refill   albuterol (VENTOLIN HFA) 108 (90 Base) MCG/ACT inhaler Inhale 2 puffs into the lungs every 4 (four) hours as needed for wheezing or shortness of breath.     aspirin 81 MG EC tablet Take 81 mg by mouth daily.     atorvastatin (LIPITOR) 40 MG tablet Take 40 mg by mouth daily at 6 PM.     Calcium Carbonate-Vitamin D 600-200 MG-UNIT TABS Take 1 tablet by mouth in the morning and at bedtime.     clopidogrel (PLAVIX) 75 MG tablet Take 1 tablet (75 mg total) by mouth daily. 30 tablet 11   empagliflozin (JARDIANCE) 25 MG TABS tablet Take 25 mg by mouth daily.     glipiZIDE (GLUCOTROL XL) 5 MG 24 hr tablet Take 5 mg by mouth daily with breakfast.     isosorbide mononitrate (IMDUR) 30 MG 24 hr tablet Take 30 mg by mouth  daily.     levothyroxine (SYNTHROID) 75 MCG tablet Take 75 mcg by mouth daily before breakfast.     linagliptin (TRADJENTA) 5 MG TABS tablet Take 5 mg by mouth daily.     lisinopril (PRINIVIL,ZESTRIL) 2.5 MG tablet Take 2.5 mg by mouth daily.     metoprolol tartrate (LOPRESSOR) 25 MG tablet Take 25 mg by mouth 2 (two) times daily.     traZODone (DESYREL) 50 MG tablet Take 50 mg by mouth at bedtime as needed for sleep.  1   vitamin B-12 (CYANOCOBALAMIN) 1000 MCG tablet Take 1,000 mcg by mouth daily.     No current facility-administered medications for this visit.        Physical Exam There were no vitals taken for this visit. Gen:  WD/WN, NAD Skin: No right carotid bruit auscultated.  Neurologically patient is intact     Assessment/Plan:  1. Symptomatic stenosis of right carotid artery without infarction Recommend:  The patient is s/p successful right carotid stent  Duplex ultrasound preoperatively shows minimal residual stenosis on the right and 40 to 59% contralateral stenosis.  Continue antiplatelet therapy as prescribed Continue management of CAD, HTN and Hyperlipidemia Healthy heart diet,  encouraged exercise at least 4 times  per week  Follow up in 6 months with duplex ultrasound and physical exam based on the patient's carotid stent and 40-59% stenosis of the left carotid artery   - VAS US CAROTID; Future      Hortencia Pilar 01/31/2021, 3:24 PM   This note was created with Dragon medical transcription system.  Any errors from dictation are unintentional.

## 2021-02-01 ENCOUNTER — Other Ambulatory Visit: Payer: Self-pay

## 2021-02-01 ENCOUNTER — Other Ambulatory Visit (INDEPENDENT_AMBULATORY_CARE_PROVIDER_SITE_OTHER): Payer: Self-pay | Admitting: Vascular Surgery

## 2021-02-01 ENCOUNTER — Encounter (INDEPENDENT_AMBULATORY_CARE_PROVIDER_SITE_OTHER): Payer: Self-pay | Admitting: Vascular Surgery

## 2021-02-01 ENCOUNTER — Ambulatory Visit (INDEPENDENT_AMBULATORY_CARE_PROVIDER_SITE_OTHER): Payer: BC Managed Care – PPO | Admitting: Vascular Surgery

## 2021-02-01 ENCOUNTER — Ambulatory Visit (INDEPENDENT_AMBULATORY_CARE_PROVIDER_SITE_OTHER): Payer: BC Managed Care – PPO

## 2021-02-01 VITALS — BP 108/68 | HR 71 | Ht 63.0 in | Wt 141.0 lb

## 2021-02-01 DIAGNOSIS — I6521 Occlusion and stenosis of right carotid artery: Secondary | ICD-10-CM | POA: Diagnosis not present

## 2021-02-01 DIAGNOSIS — T82898A Other specified complication of vascular prosthetic devices, implants and grafts, initial encounter: Secondary | ICD-10-CM

## 2021-02-05 DIAGNOSIS — Z0000 Encounter for general adult medical examination without abnormal findings: Secondary | ICD-10-CM | POA: Diagnosis not present

## 2021-02-05 DIAGNOSIS — E119 Type 2 diabetes mellitus without complications: Secondary | ICD-10-CM | POA: Diagnosis not present

## 2021-02-05 DIAGNOSIS — I10 Essential (primary) hypertension: Secondary | ICD-10-CM | POA: Diagnosis not present

## 2021-02-05 DIAGNOSIS — Z23 Encounter for immunization: Secondary | ICD-10-CM | POA: Diagnosis not present

## 2021-02-05 DIAGNOSIS — E785 Hyperlipidemia, unspecified: Secondary | ICD-10-CM | POA: Diagnosis not present

## 2021-02-05 DIAGNOSIS — E039 Hypothyroidism, unspecified: Secondary | ICD-10-CM | POA: Diagnosis not present

## 2021-02-05 DIAGNOSIS — G479 Sleep disorder, unspecified: Secondary | ICD-10-CM | POA: Diagnosis not present

## 2021-02-05 DIAGNOSIS — I779 Disorder of arteries and arterioles, unspecified: Secondary | ICD-10-CM | POA: Diagnosis not present

## 2021-02-05 DIAGNOSIS — Z0130 Encounter for examination of blood pressure without abnormal findings: Secondary | ICD-10-CM | POA: Diagnosis not present

## 2021-02-09 ENCOUNTER — Other Ambulatory Visit: Payer: Self-pay | Admitting: Family Medicine

## 2021-02-09 DIAGNOSIS — Z13820 Encounter for screening for osteoporosis: Secondary | ICD-10-CM

## 2021-02-16 ENCOUNTER — Other Ambulatory Visit: Payer: Self-pay | Admitting: Family Medicine

## 2021-02-16 DIAGNOSIS — Z13820 Encounter for screening for osteoporosis: Secondary | ICD-10-CM

## 2021-02-26 ENCOUNTER — Encounter (INDEPENDENT_AMBULATORY_CARE_PROVIDER_SITE_OTHER): Payer: Self-pay

## 2021-03-04 ENCOUNTER — Other Ambulatory Visit: Payer: Self-pay | Admitting: Family Medicine

## 2021-03-04 DIAGNOSIS — Z780 Asymptomatic menopausal state: Secondary | ICD-10-CM

## 2021-03-04 DIAGNOSIS — M8580 Other specified disorders of bone density and structure, unspecified site: Secondary | ICD-10-CM

## 2021-04-02 ENCOUNTER — Ambulatory Visit
Admission: RE | Admit: 2021-04-02 | Discharge: 2021-04-02 | Disposition: A | Discharge status: Home / Self Care | Payer: BC Managed Care – PPO | Source: Ambulatory Visit | Attending: Family Medicine | Admitting: Family Medicine

## 2021-04-02 ENCOUNTER — Other Ambulatory Visit: Payer: Self-pay

## 2021-04-02 DIAGNOSIS — Z1231 Encounter for screening mammogram for malignant neoplasm of breast: Secondary | ICD-10-CM | POA: Diagnosis not present

## 2021-04-20 ENCOUNTER — Other Ambulatory Visit: Payer: Self-pay

## 2021-04-20 ENCOUNTER — Ambulatory Visit
Admission: RE | Admit: 2021-04-20 | Discharge: 2021-04-20 | Disposition: A | Discharge status: Home / Self Care | Payer: BC Managed Care – PPO | Source: Ambulatory Visit | Attending: Family Medicine | Admitting: Family Medicine

## 2021-04-20 DIAGNOSIS — M8580 Other specified disorders of bone density and structure, unspecified site: Secondary | ICD-10-CM | POA: Insufficient documentation

## 2021-04-20 DIAGNOSIS — Z780 Asymptomatic menopausal state: Secondary | ICD-10-CM | POA: Insufficient documentation

## 2021-04-23 ENCOUNTER — Other Ambulatory Visit: Payer: BC Managed Care – PPO

## 2021-04-29 ENCOUNTER — Ambulatory Visit
Admission: RE | Admit: 2021-04-29 | Discharge: 2021-04-29 | Disposition: A | Discharge status: Home / Self Care | Payer: BC Managed Care – PPO | Source: Ambulatory Visit | Attending: Family Medicine | Admitting: Family Medicine

## 2021-04-29 ENCOUNTER — Other Ambulatory Visit: Payer: Self-pay

## 2021-04-29 DIAGNOSIS — Z780 Asymptomatic menopausal state: Secondary | ICD-10-CM | POA: Insufficient documentation

## 2021-04-29 DIAGNOSIS — M85851 Other specified disorders of bone density and structure, right thigh: Secondary | ICD-10-CM | POA: Diagnosis not present

## 2021-04-29 DIAGNOSIS — M8580 Other specified disorders of bone density and structure, unspecified site: Secondary | ICD-10-CM | POA: Insufficient documentation

## 2021-05-19 DIAGNOSIS — R0989 Other specified symptoms and signs involving the circulatory and respiratory systems: Secondary | ICD-10-CM | POA: Diagnosis not present

## 2021-05-19 DIAGNOSIS — I2510 Atherosclerotic heart disease of native coronary artery without angina pectoris: Secondary | ICD-10-CM | POA: Diagnosis not present

## 2021-06-18 DIAGNOSIS — Z0130 Encounter for examination of blood pressure without abnormal findings: Secondary | ICD-10-CM | POA: Diagnosis not present

## 2021-06-18 DIAGNOSIS — I10 Essential (primary) hypertension: Secondary | ICD-10-CM | POA: Diagnosis not present

## 2021-06-18 DIAGNOSIS — M8580 Other specified disorders of bone density and structure, unspecified site: Secondary | ICD-10-CM | POA: Diagnosis not present

## 2021-06-18 DIAGNOSIS — J449 Chronic obstructive pulmonary disease, unspecified: Secondary | ICD-10-CM | POA: Diagnosis not present

## 2021-06-18 DIAGNOSIS — E119 Type 2 diabetes mellitus without complications: Secondary | ICD-10-CM | POA: Diagnosis not present

## 2021-06-18 DIAGNOSIS — Z0000 Encounter for general adult medical examination without abnormal findings: Secondary | ICD-10-CM | POA: Diagnosis not present

## 2021-08-04 ENCOUNTER — Other Ambulatory Visit (INDEPENDENT_AMBULATORY_CARE_PROVIDER_SITE_OTHER): Payer: Self-pay | Admitting: Vascular Surgery

## 2021-08-04 DIAGNOSIS — I6523 Occlusion and stenosis of bilateral carotid arteries: Secondary | ICD-10-CM

## 2021-08-06 ENCOUNTER — Encounter (INDEPENDENT_AMBULATORY_CARE_PROVIDER_SITE_OTHER): Payer: Self-pay | Admitting: Nurse Practitioner

## 2021-08-06 ENCOUNTER — Ambulatory Visit (INDEPENDENT_AMBULATORY_CARE_PROVIDER_SITE_OTHER): Payer: BC Managed Care – PPO | Admitting: Nurse Practitioner

## 2021-08-06 ENCOUNTER — Ambulatory Visit (INDEPENDENT_AMBULATORY_CARE_PROVIDER_SITE_OTHER): Payer: BC Managed Care – PPO

## 2021-08-06 VITALS — BP 143/66 | HR 52 | Resp 17 | Ht 63.0 in | Wt 138.0 lb

## 2021-08-06 DIAGNOSIS — I6523 Occlusion and stenosis of bilateral carotid arteries: Secondary | ICD-10-CM

## 2021-08-06 DIAGNOSIS — E782 Mixed hyperlipidemia: Secondary | ICD-10-CM

## 2021-08-06 DIAGNOSIS — E119 Type 2 diabetes mellitus without complications: Secondary | ICD-10-CM | POA: Diagnosis not present

## 2021-08-09 ENCOUNTER — Encounter (INDEPENDENT_AMBULATORY_CARE_PROVIDER_SITE_OTHER): Payer: Self-pay | Admitting: Nurse Practitioner

## 2021-08-09 NOTE — Progress Notes (Signed)
Subjective:    Patient ID: Kathleen [name], female    DOB: 11-21-1950, 71 y.o.   MRN: [id number] No chief complaint on file.   The patient is seen for follow up evaluation of carotid stenosis. The carotid stenosis followed by ultrasound.  Patient has right carotid stent placed  The patient denies amaurosis fugax. There is no recent history of TIA symptoms or focal motor deficits. There is no prior documented CVA.  The patient is taking enteric-coated aspirin 81 mg daily.  There is no history of migraine headaches. There is no history of seizures.  No recent shortening of the patient's walking distance or new symptoms consistent with claudication.  No history of rest pain symptoms. No new ulcers or wounds of the lower extremities have occurred.  There is no history of DVT, PE or superficial thrombophlebitis. No recent episodes of angina or shortness of breath documented.   Carotid Duplex done today shows 40-59%.  No change compared to last study in 6 months.  The right carotid only has some mild increase in velocities.    Review of Systems  All other systems reviewed and are negative.      Objective:   Physical Exam Vitals reviewed.  HENT:     Head: Normocephalic.  Cardiovascular:     Rate and Rhythm: Normal rate.     Pulses: Normal pulses.  Pulmonary:     Effort: Pulmonary effort is normal.  Skin:    General: Skin is dry.  Neurological:     Mental Status: She is alert and oriented to person, place, and time.  Psychiatric:        Mood and Affect: Mood normal.        Behavior: Behavior normal.        Thought Content: Thought content normal.        Judgment: Judgment normal.     BP (!) 143/66 (BP Location: Right Arm)   Pulse (!) 52   Resp 17   Ht '5\' 3"'$  (1.6 m)   Wt 138 lb (62.6 kg)   BMI 24.45 kg/m   Past Medical History:  Diagnosis Date   CAD (coronary artery disease)    Cancer ([facility])    skin    HLD (hyperlipidemia)    HTN (hypertension)     Hypothyroidism    Type 2 diabetes mellitus ([facility])     Social History   Socioeconomic History   Marital status: Divorced    Spouse name: Not on file   Number of children: 3   Years of education: Not on file   Highest education level: Not on file  Occupational History   Not on file  Tobacco Use   Smoking status: Former   Smokeless tobacco: Never  Vaping Use   Vaping Use: Never used  Substance and Sexual Activity   Alcohol use: No    Alcohol/week: 0.0 standard drinks of alcohol   Drug use: No   Sexual activity: Not on file  Other Topics Concern   Not on file  Social History Narrative   Lost a child 2 years ago.    Lives by herself: daughter Hillis Range lives next door.   Social Determinants of Health   Financial Resource Strain: Not on file  Food Insecurity: Not on file  Transportation Needs: Not on file  Physical Activity: Not on file  Stress: Not on file  Social Connections: Not on file  Intimate Partner Violence: Not on file    Past Surgical History:  Procedure Laterality Date   CAROTID PTA/STENT INTERVENTION N/A 12/29/2020   Procedure: CAROTID PTA/STENT INTERVENTION;  Surgeon: Katha Cabal, MD;  Location: [facility];  Service: Cardiovascular;  Laterality: N/A;   CHOLECYSTECTOMY     VAGINAL HYSTERECTOMY      Family History  Problem Relation Age of Onset   Heart failure Father    Mental illness Mother    Breast cancer Neg Hx     No Known Allergies     Latest Ref Rng & Units 12/30/2020    4:12 AM 12/31/2014   12:58 AM  CBC  WBC 4.0 - 10.5 K/uL 7.4  11.6   Hemoglobin 12.0 - 15.0 g/dL 11.1  11.1   Hematocrit 36.0 - 46.0 % 34.4  34.4   Platelets 150 - 400 K/uL 135  232       CMP     Component Value Date/Time   NA 141 12/30/2020 0412   K 4.0 12/30/2020 0412   CL 112 (H) 12/30/2020 0412   CO2 23 12/30/2020 0412   GLUCOSE 87 12/30/2020 0412   BUN 14 12/30/2020 0412   CREATININE 0.72 12/30/2020 0412   CALCIUM 8.3 (L) 12/30/2020 0412    PROT 7.6 12/31/2014 0058   ALBUMIN 4.1 12/31/2014 0058   AST 21 12/31/2014 0058   ALT 17 12/31/2014 0058   ALKPHOS 145 (H) 12/31/2014 0058   BILITOT 0.5 12/31/2014 0058   GFRNONAA >60 12/30/2020 0412   GFRAA >60 12/31/2014 0058     No results found.     Assessment & Plan:   1. Bilateral carotid artery stenosis Recommend:  Given the patient's asymptomatic subcritical stenosis no further invasive testing or surgery at this time.  Duplex ultrasound shows 40-59% stenosis bilaterally.  Continue antiplatelet therapy as prescribed Continue management of CAD, HTN and Hyperlipidemia Healthy heart diet,  encouraged exercise at least 4 times per week Follow up in 6 months with duplex ultrasound and physical exam    2. Type 2 diabetes mellitus without complication, unspecified whether long term insulin use ([facility]) Continue hypoglycemic medications as already ordered, these medications have been reviewed and there are no changes at this time.  Hgb A1C to be monitored as already arranged by primary service   3. Mixed hyperlipidemia Continue statin as ordered and reviewed, no changes at this time    Current Outpatient Medications on File Prior to Visit  Medication Sig Dispense Refill   albuterol (VENTOLIN HFA) 108 (90 Base) MCG/ACT inhaler Inhale 2 puffs into the lungs every 4 (four) hours as needed for wheezing or shortness of breath.     aspirin 81 MG EC tablet Take 81 mg by mouth daily.     atorvastatin (LIPITOR) 40 MG tablet Take 40 mg by mouth daily at 6 PM.     Calcium Carbonate-Vitamin D 600-200 MG-UNIT TABS Take 1 tablet by mouth in the morning and at bedtime.     clopidogrel (PLAVIX) 75 MG tablet Take 1 tablet (75 mg total) by mouth daily. 30 tablet 11   empagliflozin (JARDIANCE) 25 MG TABS tablet Take 25 mg by mouth daily.     isosorbide mononitrate (IMDUR) 30 MG 24 hr tablet Take 30 mg by mouth daily.     levothyroxine (SYNTHROID) 75 MCG tablet Take 75 mcg by mouth daily  before breakfast.     linagliptin (TRADJENTA) 5 MG TABS tablet Take 5 mg by mouth daily.     lisinopril (PRINIVIL,ZESTRIL) 2.5 MG tablet Take 2.5 mg by mouth  daily.     metoprolol tartrate (LOPRESSOR) 25 MG tablet Take 25 mg by mouth 2 (two) times daily.     traZODone (DESYREL) 50 MG tablet Take 50 mg by mouth at bedtime as needed for sleep.  1   vitamin B-12 (CYANOCOBALAMIN) 1000 MCG tablet Take 1,000 mcg by mouth daily.     glipiZIDE (GLUCOTROL XL) 5 MG 24 hr tablet Take 5 mg by mouth daily with breakfast. (Patient not taking: Reported on 08/06/2021)     No current facility-administered medications on file prior to visit.    There are no Patient Instructions on file for this visit. No follow-ups on file.   Kris Hartmann, NP

## 2021-10-08 DIAGNOSIS — E039 Hypothyroidism, unspecified: Secondary | ICD-10-CM | POA: Diagnosis not present

## 2021-10-08 DIAGNOSIS — E119 Type 2 diabetes mellitus without complications: Secondary | ICD-10-CM | POA: Diagnosis not present

## 2021-10-08 DIAGNOSIS — I10 Essential (primary) hypertension: Secondary | ICD-10-CM | POA: Diagnosis not present

## 2021-10-08 DIAGNOSIS — Z0000 Encounter for general adult medical examination without abnormal findings: Secondary | ICD-10-CM | POA: Diagnosis not present

## 2021-10-08 DIAGNOSIS — Z712 Person consulting for explanation of examination or test findings: Secondary | ICD-10-CM | POA: Diagnosis not present

## 2021-10-08 DIAGNOSIS — I779 Disorder of arteries and arterioles, unspecified: Secondary | ICD-10-CM | POA: Diagnosis not present

## 2021-10-08 DIAGNOSIS — Z1389 Encounter for screening for other disorder: Secondary | ICD-10-CM | POA: Diagnosis not present

## 2021-10-08 DIAGNOSIS — Z1331 Encounter for screening for depression: Secondary | ICD-10-CM | POA: Diagnosis not present

## 2021-10-08 DIAGNOSIS — E785 Hyperlipidemia, unspecified: Secondary | ICD-10-CM | POA: Diagnosis not present

## 2021-10-08 DIAGNOSIS — Z0130 Encounter for examination of blood pressure without abnormal findings: Secondary | ICD-10-CM | POA: Diagnosis not present

## 2021-11-10 DIAGNOSIS — I2510 Atherosclerotic heart disease of native coronary artery without angina pectoris: Secondary | ICD-10-CM | POA: Diagnosis not present

## 2021-11-10 DIAGNOSIS — I071 Rheumatic tricuspid insufficiency: Secondary | ICD-10-CM | POA: Diagnosis not present

## 2021-11-10 DIAGNOSIS — I10 Essential (primary) hypertension: Secondary | ICD-10-CM | POA: Diagnosis not present

## 2021-11-10 DIAGNOSIS — R011 Cardiac murmur, unspecified: Secondary | ICD-10-CM | POA: Diagnosis not present

## 2021-11-10 DIAGNOSIS — Z955 Presence of coronary angioplasty implant and graft: Secondary | ICD-10-CM | POA: Diagnosis not present

## 2021-12-27 ENCOUNTER — Encounter (INDEPENDENT_AMBULATORY_CARE_PROVIDER_SITE_OTHER): Payer: Self-pay

## 2022-02-03 ENCOUNTER — Other Ambulatory Visit (INDEPENDENT_AMBULATORY_CARE_PROVIDER_SITE_OTHER): Payer: Self-pay | Admitting: Nurse Practitioner

## 2022-02-03 DIAGNOSIS — I6523 Occlusion and stenosis of bilateral carotid arteries: Secondary | ICD-10-CM

## 2022-02-04 ENCOUNTER — Encounter (INDEPENDENT_AMBULATORY_CARE_PROVIDER_SITE_OTHER): Payer: Self-pay | Admitting: Nurse Practitioner

## 2022-02-04 ENCOUNTER — Ambulatory Visit (INDEPENDENT_AMBULATORY_CARE_PROVIDER_SITE_OTHER): Payer: BC Managed Care – PPO

## 2022-02-04 ENCOUNTER — Ambulatory Visit (INDEPENDENT_AMBULATORY_CARE_PROVIDER_SITE_OTHER): Payer: BC Managed Care – PPO | Admitting: Nurse Practitioner

## 2022-02-04 VITALS — BP 128/72 | HR 56 | Resp 16 | Wt 148.0 lb

## 2022-02-04 DIAGNOSIS — I6523 Occlusion and stenosis of bilateral carotid arteries: Secondary | ICD-10-CM | POA: Diagnosis not present

## 2022-02-04 DIAGNOSIS — E782 Mixed hyperlipidemia: Secondary | ICD-10-CM | POA: Diagnosis not present

## 2022-02-04 DIAGNOSIS — I10 Essential (primary) hypertension: Secondary | ICD-10-CM | POA: Diagnosis not present

## 2022-02-04 DIAGNOSIS — I6521 Occlusion and stenosis of right carotid artery: Secondary | ICD-10-CM

## 2022-02-04 NOTE — Progress Notes (Signed)
Subjective:    Patient ID: Kathleen [name], female    DOB: 27-Mar-1950, 71 y.o.   MRN: [id number] Chief Complaint  Patient presents with   Follow-up    Ultrasound follow up    The patient is seen for follow up evaluation of carotid stenosis. The carotid stenosis followed by ultrasound.   The patient denies amaurosis fugax. There is no recent history of TIA symptoms or focal motor deficits. There is no prior documented CVA.  The patient is taking enteric-coated aspirin 81 mg daily.  There is no history of migraine headaches. There is no history of seizures.  No recent shortening of the patient's walking distance or new symptoms consistent with claudication.  No history of rest pain symptoms. No new ulcers or wounds of the lower extremities have occurred.  There is no history of DVT, PE or superficial thrombophlebitis. No recent episodes of angina or shortness of breath documented.   Carotid Duplex done today shows 40-59% stenosis bilaterally.  No change compared to last study in 08/06/2021    Review of Systems  All other systems reviewed and are negative.      Objective:   Physical Exam Vitals reviewed.  HENT:     Head: Normocephalic.  Neck:     Vascular: No carotid bruit.  Cardiovascular:     Rate and Rhythm: Normal rate and regular rhythm.     Pulses: Normal pulses.  Pulmonary:     Effort: Pulmonary effort is normal.  Skin:    General: Skin is warm and dry.  Neurological:     Mental Status: She is alert and oriented to person, place, and time.  Psychiatric:        Mood and Affect: Mood normal.        Behavior: Behavior normal.        Thought Content: Thought content normal.        Judgment: Judgment normal.     BP 128/72 (BP Location: Left Arm)   Pulse (!) 56   Resp 16   Wt 148 lb (67.1 kg)   BMI 26.22 kg/m   Past Medical History:  Diagnosis Date   CAD (coronary artery disease)    Cancer ([facility])    skin    HLD (hyperlipidemia)    HTN (hypertension)     Hypothyroidism    Type 2 diabetes mellitus ([facility])     Social History   Socioeconomic History   Marital status: Divorced    Spouse name: Not on file   Number of children: 3   Years of education: Not on file   Highest education level: Not on file  Occupational History   Not on file  Tobacco Use   Smoking status: Former   Smokeless tobacco: Never  Vaping Use   Vaping Use: Never used  Substance and Sexual Activity   Alcohol use: No    Alcohol/week: 0.0 standard drinks of alcohol   Drug use: No   Sexual activity: Not on file  Other Topics Concern   Not on file  Social History Narrative   Lost a child 2 years ago.    Lives by herself: daughter Hillis Range lives next door.   Social Determinants of Health   Financial Resource Strain: Not on file  Food Insecurity: Not on file  Transportation Needs: Not on file  Physical Activity: Not on file  Stress: Not on file  Social Connections: Not on file  Intimate Partner Violence: Not on file    Past  Surgical History:  Procedure Laterality Date   CAROTID PTA/STENT INTERVENTION N/A 12/29/2020   Procedure: CAROTID PTA/STENT INTERVENTION;  Surgeon: Katha Cabal, MD;  Location: [facility];  Service: Cardiovascular;  Laterality: N/A;   CHOLECYSTECTOMY     VAGINAL HYSTERECTOMY      Family History  Problem Relation Age of Onset   Heart failure Father    Mental illness Mother    Breast cancer Neg Hx     No Known Allergies     Latest Ref Rng & Units 12/30/2020    4:12 AM 12/31/2014   12:58 AM  CBC  WBC 4.0 - 10.5 K/uL 7.4  11.6   Hemoglobin 12.0 - 15.0 g/dL 11.1  11.1   Hematocrit 36.0 - 46.0 % 34.4  34.4   Platelets 150 - 400 K/uL 135  232       CMP     Component Value Date/Time   NA 141 12/30/2020 0412   K 4.0 12/30/2020 0412   CL 112 (H) 12/30/2020 0412   CO2 23 12/30/2020 0412   GLUCOSE 87 12/30/2020 0412   BUN 14 12/30/2020 0412   CREATININE 0.72 12/30/2020 0412   CALCIUM 8.3 (L) 12/30/2020  0412   PROT 7.6 12/31/2014 0058   ALBUMIN 4.1 12/31/2014 0058   AST 21 12/31/2014 0058   ALT 17 12/31/2014 0058   ALKPHOS 145 (H) 12/31/2014 0058   BILITOT 0.5 12/31/2014 0058   GFRNONAA >60 12/30/2020 0412   GFRAA >60 12/31/2014 0058     No results found.     Assessment & Plan:   1. Symptomatic stenosis of right carotid artery without infarction Recommend:  The patient is s/p successful right carotid stent on 12/29/2020  Duplex ultrasound shows 40-59% stenosis bilaterally   Continue antiplatelet therapy as prescribed Continue management of CAD, HTN and Hyperlipidemia Healthy heart diet,  encouraged exercise at least 4 times per week  Follow up in12 months with duplex and exam  2. Mixed hyperlipidemia Continue statin as ordered and reviewed, no changes at this time  3. Essential hypertension Continue antihypertensive medications as already ordered, these medications have been reviewed and there are no changes at this time.   Current Outpatient Medications on File Prior to Visit  Medication Sig Dispense Refill   albuterol (VENTOLIN HFA) 108 (90 Base) MCG/ACT inhaler Inhale 2 puffs into the lungs every 4 (four) hours as needed for wheezing or shortness of breath.     aspirin 81 MG EC tablet Take 81 mg by mouth daily.     atorvastatin (LIPITOR) 40 MG tablet Take 40 mg by mouth daily at 6 PM.     Calcium Carbonate-Vitamin D 600-200 MG-UNIT TABS Take 1 tablet by mouth in the morning and at bedtime.     clopidogrel (PLAVIX) 75 MG tablet Take 1 tablet (75 mg total) by mouth daily. 30 tablet 11   empagliflozin (JARDIANCE) 25 MG TABS tablet Take 25 mg by mouth daily.     isosorbide mononitrate (IMDUR) 30 MG 24 hr tablet Take 30 mg by mouth daily.     levothyroxine (SYNTHROID) 75 MCG tablet Take 75 mcg by mouth daily before breakfast.     linagliptin (TRADJENTA) 5 MG TABS tablet Take 5 mg by mouth daily.     lisinopril (PRINIVIL,ZESTRIL) 2.5 MG tablet Take 2.5 mg by mouth daily.      metoprolol tartrate (LOPRESSOR) 25 MG tablet Take 25 mg by mouth 2 (two) times daily.     traZODone (DESYREL)  50 MG tablet Take 50 mg by mouth at bedtime as needed for sleep.  1   vitamin B-12 (CYANOCOBALAMIN) 1000 MCG tablet Take 1,000 mcg by mouth daily.     glipiZIDE (GLUCOTROL XL) 5 MG 24 hr tablet Take 5 mg by mouth daily with breakfast. (Patient not taking: Reported on 08/06/2021)     No current facility-administered medications on file prior to visit.    There are no Patient Instructions on file for this visit. No follow-ups on file.   Kris Hartmann, NP

## 2022-03-28 ENCOUNTER — Other Ambulatory Visit: Payer: Self-pay | Admitting: Family Medicine

## 2022-03-28 DIAGNOSIS — Z1231 Encounter for screening mammogram for malignant neoplasm of breast: Secondary | ICD-10-CM

## 2022-04-15 ENCOUNTER — Ambulatory Visit
Admission: RE | Admit: 2022-04-15 | Discharge: 2022-04-15 | Disposition: A | Discharge status: Home / Self Care | Payer: Medicare HMO | Source: Ambulatory Visit | Attending: Family Medicine | Admitting: Family Medicine

## 2022-04-15 DIAGNOSIS — Z1231 Encounter for screening mammogram for malignant neoplasm of breast: Secondary | ICD-10-CM | POA: Diagnosis present

## 2022-08-01 IMAGING — MG MM DIGITAL SCREENING BILAT W/ TOMO AND CAD
8 series · 9 of 24 positions shown · non-contrast
Comparison: Previous exam(s).

CLINICAL DATA: Screening.

EXAM:
DIGITAL SCREENING BILATERAL MAMMOGRAM WITH TOMOSYNTHESIS AND CAD
TECHNIQUE: Bilateral screening digital craniocaudal and mediolateral oblique
mammograms were obtained. Bilateral screening digital breast
tomosynthesis was performed. The images were evaluated with
computer-aided detection.

[R MLO synth-2D]
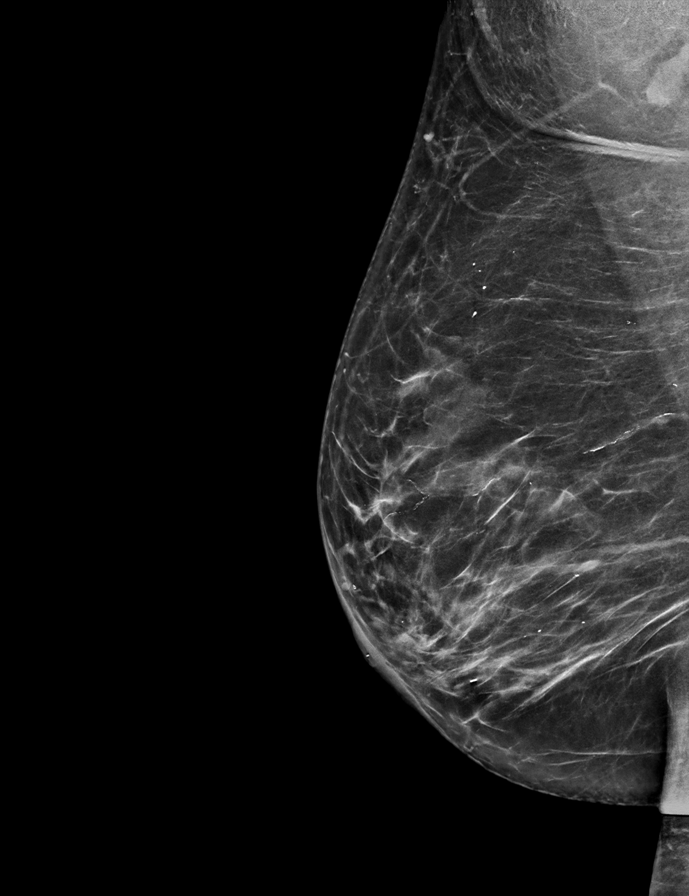

[L CC synth-2D]
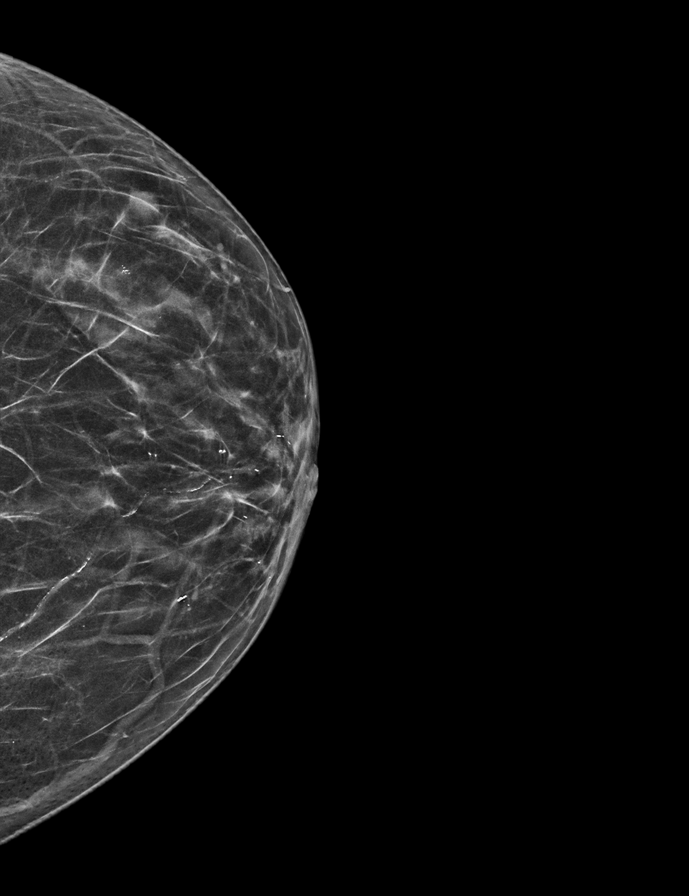

[L MLO synth-2D]
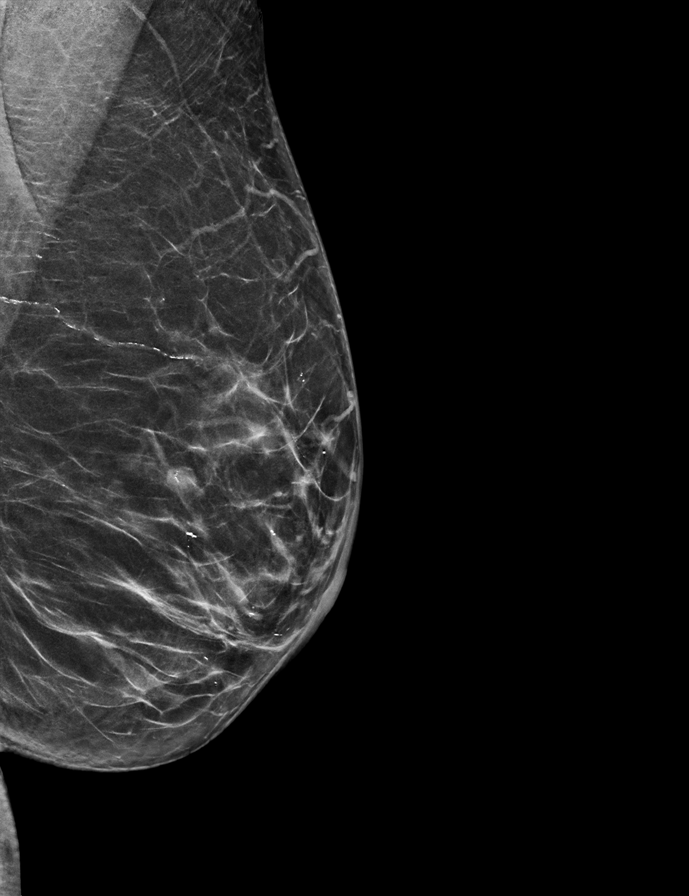

[R CC synth-2D]
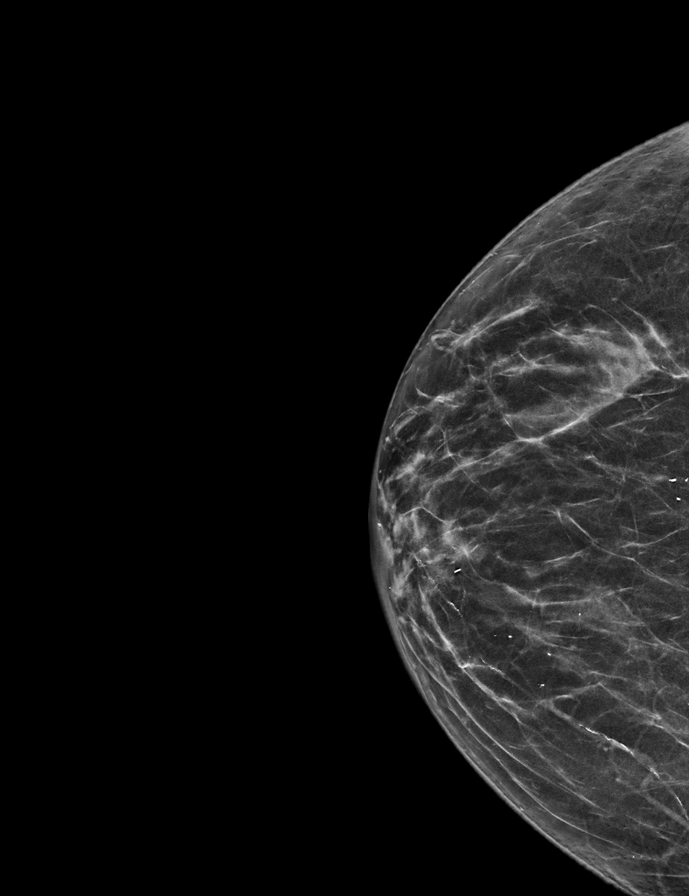

[R MLO tomo · 2 of 70 frames shown]
[frame 23/70]
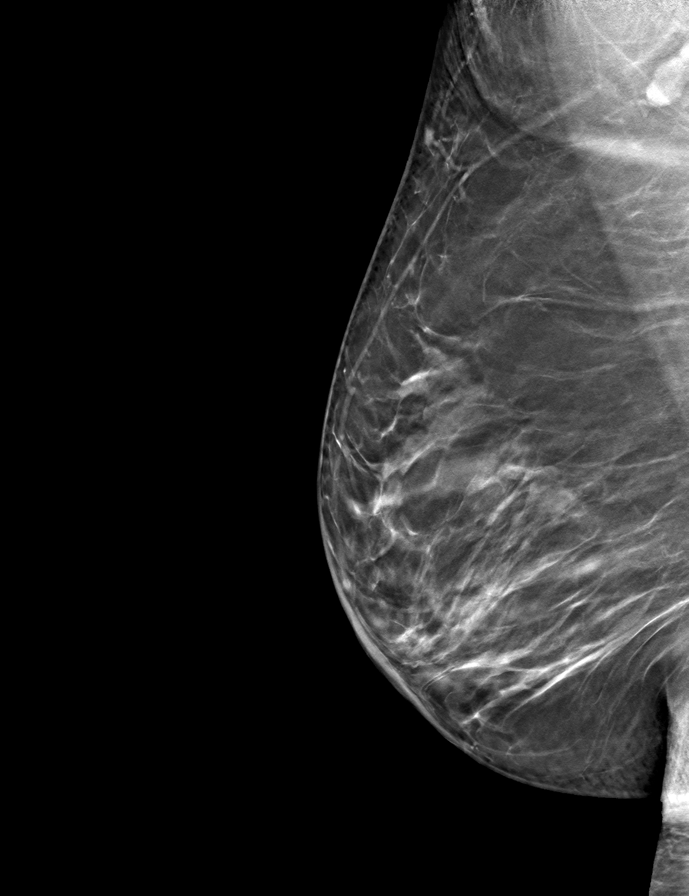
[frame 35/70]
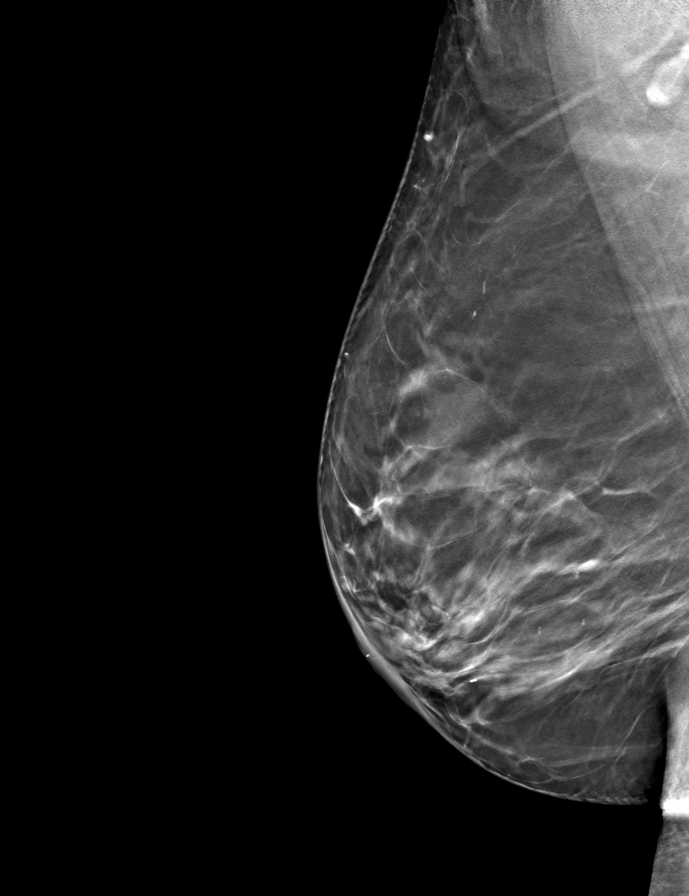

[L CC tomo · tomo slice 29/58.0]
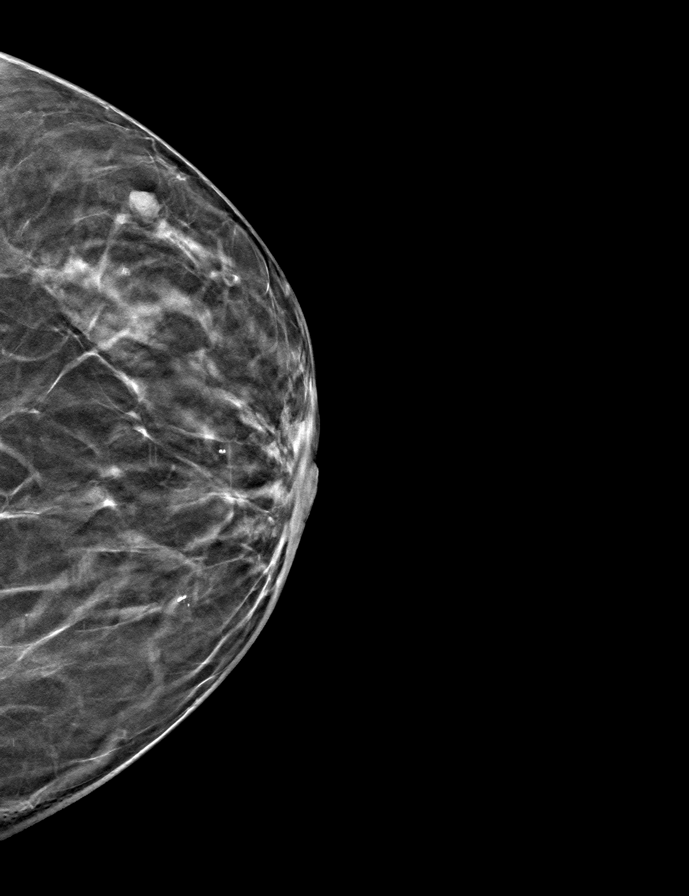

[R CC tomo · tomo slice 29/57.0]
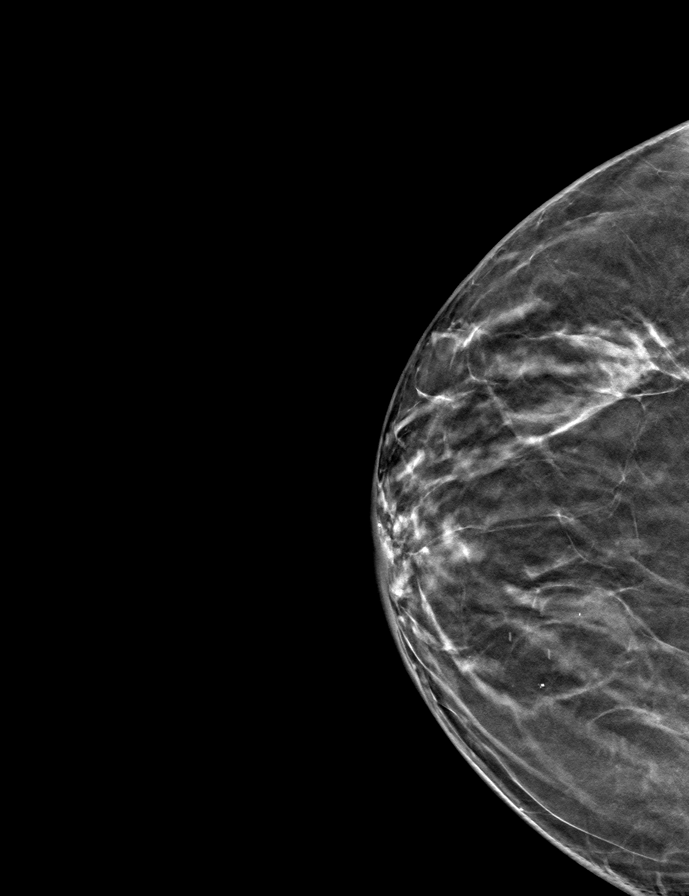

[L MLO tomo · tomo slice 33/65.0]
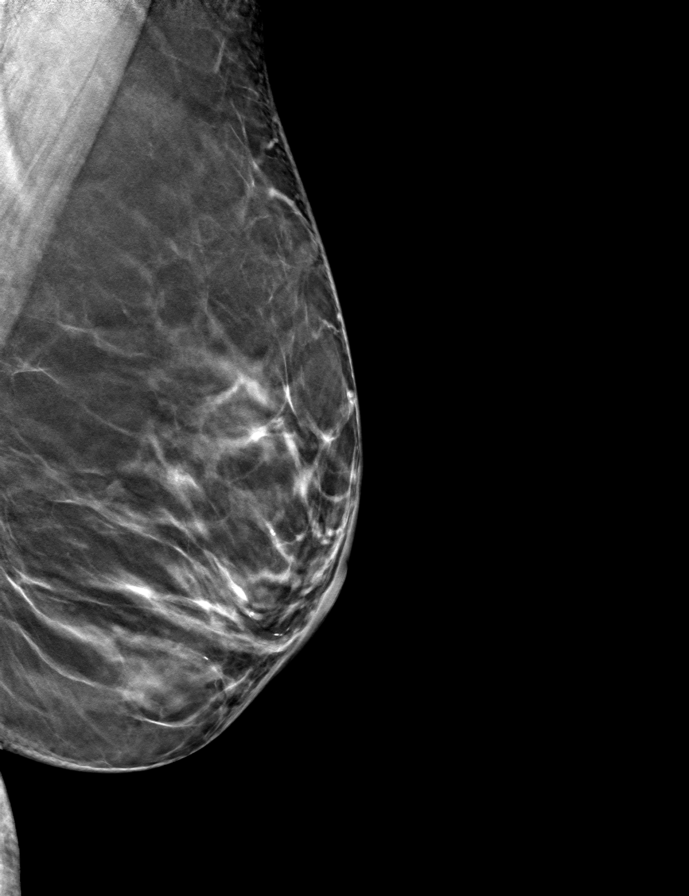

[9 of 24 positions shown; findings below may reference images not displayed]

ACR Breast Density Category b: There are scattered areas of
fibroglandular density.
FINDINGS: There are no findings suspicious for malignancy.
IMPRESSION: No mammographic evidence of malignancy. A result letter of this
screening mammogram will be mailed directly to the patient.

RECOMMENDATION:
Screening mammogram in one year. (Code:51-O-LD2)

BI-RADS CATEGORY  1: Negative.

## 2023-02-03 ENCOUNTER — Ambulatory Visit (INDEPENDENT_AMBULATORY_CARE_PROVIDER_SITE_OTHER): Payer: Medicare Other | Admitting: Nurse Practitioner

## 2023-02-03 ENCOUNTER — Encounter (INDEPENDENT_AMBULATORY_CARE_PROVIDER_SITE_OTHER): Payer: BC Managed Care – PPO

## 2023-02-08 ENCOUNTER — Other Ambulatory Visit (INDEPENDENT_AMBULATORY_CARE_PROVIDER_SITE_OTHER): Payer: Self-pay | Admitting: Nurse Practitioner

## 2023-02-08 DIAGNOSIS — I6523 Occlusion and stenosis of bilateral carotid arteries: Secondary | ICD-10-CM

## 2023-02-10 ENCOUNTER — Ambulatory Visit (INDEPENDENT_AMBULATORY_CARE_PROVIDER_SITE_OTHER): Payer: Medicare HMO

## 2023-02-10 ENCOUNTER — Ambulatory Visit (INDEPENDENT_AMBULATORY_CARE_PROVIDER_SITE_OTHER): Payer: Medicare HMO | Admitting: Nurse Practitioner

## 2023-02-10 VITALS — BP 180/74 | HR 56 | Resp 16 | Wt 150.6 lb

## 2023-02-10 DIAGNOSIS — G458 Other transient cerebral ischemic attacks and related syndromes: Secondary | ICD-10-CM | POA: Diagnosis not present

## 2023-02-10 DIAGNOSIS — I6523 Occlusion and stenosis of bilateral carotid arteries: Secondary | ICD-10-CM

## 2023-02-10 DIAGNOSIS — I6521 Occlusion and stenosis of right carotid artery: Secondary | ICD-10-CM

## 2023-02-10 DIAGNOSIS — E782 Mixed hyperlipidemia: Secondary | ICD-10-CM | POA: Diagnosis not present

## 2023-02-10 DIAGNOSIS — I10 Essential (primary) hypertension: Secondary | ICD-10-CM

## 2023-02-14 ENCOUNTER — Encounter (INDEPENDENT_AMBULATORY_CARE_PROVIDER_SITE_OTHER): Payer: Self-pay | Admitting: Nurse Practitioner

## 2023-02-14 NOTE — Progress Notes (Signed)
Subjective:    Patient ID: Kathleen [name], female    DOB: 1950/10/18, 72 y.o.   MRN: [id number] Chief Complaint  Patient presents with   Follow-up    1 yr carotid follow up    The patient is seen for follow up evaluation of carotid stenosis. The carotid stenosis followed by ultrasound.   The patient denies amaurosis fugax. There is no recent history of TIA symptoms or focal motor deficits. There is no prior documented CVA.  The patient is taking enteric-coated aspirin 81 mg daily.  There is no history of migraine headaches. There is no history of seizures.  No recent shortening of the patient's walking distance or new symptoms consistent with claudication.  No history of rest pain symptoms. No new ulcers or wounds of the lower extremities have occurred.  Denies any dizziness.  She denies any syncopal or presyncopal episodes.  She denies any pain, numbness or weakness in her upper extremities.  There is no history of DVT, PE or superficial thrombophlebitis. No recent episodes of angina or shortness of breath documented.   Carotid Duplex done today shows 1 to 39% in the right ICA and 40 to 59% in the left.  There is no significant change in velocities from the previous studies in 2023.  However her left subclavian vein is noted to be stenotic with evidence of retrograde left vertebral artery.    Review of Systems  All other systems reviewed and are negative.      Objective:   Physical Exam Vitals reviewed.  HENT:     Head: Normocephalic.  Neck:     Vascular: No carotid bruit.  Cardiovascular:     Rate and Rhythm: Normal rate and regular rhythm.     Pulses: Normal pulses.  Pulmonary:     Effort: Pulmonary effort is normal.  Skin:    General: Skin is warm and dry.  Neurological:     Mental Status: She is alert and oriented to person, place, and time.  Psychiatric:        Mood and Affect: Mood normal.        Behavior: Behavior normal.        Thought Content: Thought  content normal.        Judgment: Judgment normal.     BP (!) 180/74   Pulse (!) 56   Resp 16   Wt 150 lb 9.6 oz (68.3 kg)   BMI 26.68 kg/m   Past Medical History:  Diagnosis Date   CAD (coronary artery disease)    Cancer (HCC)    skin    HLD (hyperlipidemia)    HTN (hypertension)    Hypothyroidism    Type 2 diabetes mellitus (HCC)     Social History   Socioeconomic History   Marital status: Divorced    Spouse name: Not on file   Number of children: 3   Years of education: Not on file   Highest education level: Not on file  Occupational History   Not on file  Tobacco Use   Smoking status: Former   Smokeless tobacco: Never  Vaping Use   Vaping status: Never Used  Substance and Sexual Activity   Alcohol use: No    Alcohol/week: 0.0 standard drinks of alcohol   Drug use: No   Sexual activity: Not on file  Other Topics Concern   Not on file  Social History Narrative   Lost a child 2 years ago.    Lives by herself: daughter  Staci Acosta lives next door.   Social Drivers of Corporate investment banker Strain: Not on file  Food Insecurity: Not on file  Transportation Needs: Not on file  Physical Activity: Not on file  Stress: Not on file  Social Connections: Not on file  Intimate Partner Violence: Not on file    Past Surgical History:  Procedure Laterality Date   CAROTID PTA/STENT INTERVENTION N/A 12/29/2020   Procedure: CAROTID PTA/STENT INTERVENTION;  Surgeon: Renford Dills, MD;  Location: [facility] INVASIVE CV LAB;  Service: Cardiovascular;  Laterality: N/A;   CHOLECYSTECTOMY     VAGINAL HYSTERECTOMY      Family History  Problem Relation Age of Onset   Heart failure Father    Mental illness Mother    Breast cancer Neg Hx     No Known Allergies     Latest Ref Rng & Units 12/30/2020    4:12 AM 12/31/2014   12:58 AM  CBC  WBC 4.0 - 10.5 K/uL 7.4  11.6   Hemoglobin 12.0 - 15.0 g/dL 29.5  28.4   Hematocrit 36.0 - 46.0 % 34.4  34.4   Platelets 150  - 400 K/uL 135  232       CMP     Component Value Date/Time   NA 141 12/30/2020 0412   K 4.0 12/30/2020 0412   CL 112 (H) 12/30/2020 0412   CO2 23 12/30/2020 0412   GLUCOSE 87 12/30/2020 0412   BUN 14 12/30/2020 0412   CREATININE 0.72 12/30/2020 0412   CALCIUM 8.3 (L) 12/30/2020 0412   PROT 7.6 12/31/2014 0058   ALBUMIN 4.1 12/31/2014 0058   AST 21 12/31/2014 0058   ALT 17 12/31/2014 0058   ALKPHOS 145 (H) 12/31/2014 0058   BILITOT 0.5 12/31/2014 0058   GFRNONAA >60 12/30/2020 0412   GFRAA >60 12/31/2014 0058     No results found.     Assessment & Plan:   1. 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  Continue antiplatelet therapy as prescribed Continue management of CAD, HTN and Hyperlipidemia Healthy heart diet,  encouraged exercise at least 4 times per week  Follow up in12 months with duplex and exam  2. Mixed hyperlipidemia Continue statin as ordered and reviewed, no changes at this time  3. Essential hypertension Continue antihypertensive medications as already ordered, these medications have been reviewed and there are no changes at this time.   4. Subclavian steal syndrome The patient has evidence of steal on ultrasound today but she is asymptomatic.  We discussed signs and symptoms to be aware of such as dizziness when using her upper extremities.  Weakness or pain with use of her left upper extremity in addition to dizziness when looking upward.  If she begins experiencing any of the symptoms she is advised to contact our office but otherwise we will continue to monitor this annually in addition to her carotid stenosis.   Current Outpatient Medications on File Prior to Visit  Medication Sig Dispense Refill   albuterol (VENTOLIN HFA) 108 (90 Base) MCG/ACT inhaler Inhale 2  puffs into the lungs every 4 (four) hours as needed for wheezing or shortness of breath.     aspirin 81 MG EC tablet Take 81 mg by mouth daily.     atorvastatin (LIPITOR) 40 MG tablet  Take 40 mg by mouth daily at 6 PM.     Calcium Carbonate-Vitamin D 600-200 MG-UNIT TABS Take 1 tablet by mouth in the morning and at bedtime.     clopidogrel (PLAVIX) 75 MG tablet Take 1 tablet (75 mg total) by mouth daily. 30 tablet 11   empagliflozin (JARDIANCE) 25 MG TABS tablet Take 25 mg by mouth daily.     isosorbide mononitrate (IMDUR) 30 MG 24 hr tablet Take 30 mg by mouth daily.     levothyroxine (SYNTHROID) 75 MCG tablet Take 75 mcg by mouth daily before breakfast.     linagliptin (TRADJENTA) 5 MG TABS tablet Take 5 mg by mouth daily.     lisinopril (PRINIVIL,ZESTRIL) 2.5 MG tablet Take 2.5 mg by mouth daily.     metoprolol tartrate (LOPRESSOR) 25 MG tablet Take 25 mg by mouth 2 (two) times daily.     traZODone (DESYREL) 50 MG tablet Take 50 mg by mouth at bedtime as needed for sleep.  1   vitamin B-12 (CYANOCOBALAMIN) 1000 MCG tablet Take 1,000 mcg by mouth daily.     glipiZIDE (GLUCOTROL XL) 5 MG 24 hr tablet Take 5 mg by mouth daily with breakfast. (Patient not taking: Reported on 08/06/2021)     No current facility-administered medications on file prior to visit.    There are no Patient Instructions on file for this visit. No follow-ups on file.   Georgiana Spinner, NP

## 2023-03-03 ENCOUNTER — Ambulatory Visit
Admission: RE | Admit: 2023-03-03 | Discharge: 2023-03-03 | Disposition: A | Discharge status: Home / Self Care | Payer: Medicare HMO | Attending: Gastroenterology | Admitting: Gastroenterology

## 2023-03-03 ENCOUNTER — Ambulatory Visit: Payer: Medicare HMO | Admitting: Anesthesiology

## 2023-03-03 ENCOUNTER — Encounter
Admission: RE | Disposition: A | Discharge status: Home / Self Care | Payer: Self-pay | Source: Home / Self Care | Attending: Gastroenterology

## 2023-03-03 ENCOUNTER — Encounter: Payer: Self-pay | Admitting: *Deleted

## 2023-03-03 ENCOUNTER — Other Ambulatory Visit: Payer: Self-pay

## 2023-03-03 DIAGNOSIS — Z79890 Hormone replacement therapy (postmenopausal): Secondary | ICD-10-CM | POA: Diagnosis not present

## 2023-03-03 DIAGNOSIS — I2510 Atherosclerotic heart disease of native coronary artery without angina pectoris: Secondary | ICD-10-CM | POA: Diagnosis not present

## 2023-03-03 DIAGNOSIS — Z1211 Encounter for screening for malignant neoplasm of colon: Secondary | ICD-10-CM | POA: Insufficient documentation

## 2023-03-03 DIAGNOSIS — Z87891 Personal history of nicotine dependence: Secondary | ICD-10-CM | POA: Diagnosis not present

## 2023-03-03 DIAGNOSIS — Z7902 Long term (current) use of antithrombotics/antiplatelets: Secondary | ICD-10-CM | POA: Diagnosis not present

## 2023-03-03 DIAGNOSIS — Z7984 Long term (current) use of oral hypoglycemic drugs: Secondary | ICD-10-CM | POA: Diagnosis not present

## 2023-03-03 DIAGNOSIS — D120 Benign neoplasm of cecum: Secondary | ICD-10-CM | POA: Insufficient documentation

## 2023-03-03 DIAGNOSIS — E119 Type 2 diabetes mellitus without complications: Secondary | ICD-10-CM | POA: Insufficient documentation

## 2023-03-03 DIAGNOSIS — K5520 Angiodysplasia of colon without hemorrhage: Secondary | ICD-10-CM | POA: Diagnosis not present

## 2023-03-03 DIAGNOSIS — I10 Essential (primary) hypertension: Secondary | ICD-10-CM | POA: Insufficient documentation

## 2023-03-03 DIAGNOSIS — E039 Hypothyroidism, unspecified: Secondary | ICD-10-CM | POA: Insufficient documentation

## 2023-03-03 DIAGNOSIS — Z860101 Personal history of adenomatous and serrated colon polyps: Secondary | ICD-10-CM | POA: Insufficient documentation

## 2023-03-03 HISTORY — PX: POLYPECTOMY: SHX5525

## 2023-03-03 HISTORY — PX: COLONOSCOPY WITH PROPOFOL: SHX5780

## 2023-03-03 LAB — GLUCOSE, CAPILLARY: Glucose-Capillary: 123 mg/dL — ABNORMAL HIGH (ref 70–99)

## 2023-03-03 SURGERY — COLONOSCOPY WITH PROPOFOL
Anesthesia: General

## 2023-03-03 MED ORDER — PHENYLEPHRINE HCL (PRESSORS) 10 MG/ML IV SOLN
INTRAVENOUS | Status: DC | PRN
  Administered 2023-03-03 (×3): 80 ug via INTRAVENOUS

## 2023-03-03 MED ORDER — SODIUM CHLORIDE 0.9 % IV SOLN
INTRAVENOUS | Status: DC
Start: 2023-03-03 — End: 2023-03-03

## 2023-03-03 MED ORDER — PROPOFOL 500 MG/50ML IV EMUL
INTRAVENOUS | Status: DC | PRN
  Administered 2023-03-03: 100 ug/kg/min via INTRAVENOUS

## 2023-03-03 NOTE — Transfer of Care (Signed)
 Immediate Anesthesia Transfer of Care Note  Patient: Kathleen [name]  Procedure(s) Performed: COLONOSCOPY WITH PROPOFOL  POLYPECTOMY  Patient Location: PACU  Anesthesia Type:General  Level of Consciousness: sedated  Airway & Oxygen Therapy: Patient Spontanous Breathing and Patient connected to nasal cannula oxygen  Post-op Assessment: Report given to RN, Post -op Vital signs reviewed and stable, and Patient moving all extremities  Post vital signs: Reviewed and stable  Last Vitals:  Vitals Value Taken Time  BP 87/51 03/03/23 1029  Temp 36.1 C 03/03/23 1029  Pulse 50 03/03/23 1029  Resp 18 03/03/23 1029  SpO2 100 % 03/03/23 1029    Last Pain:  Vitals:   03/03/23 1029  TempSrc: Temporal  PainSc: Asleep         Complications: No notable events documented.

## 2023-03-03 NOTE — Anesthesia Postprocedure Evaluation (Signed)
 Anesthesia Post Note  Patient: Kathleen [name]  Procedure(s) Performed: COLONOSCOPY WITH PROPOFOL  POLYPECTOMY  Patient location during evaluation: PACU Anesthesia Type: General Level of consciousness: awake and alert, oriented and patient cooperative Pain management: pain level controlled Vital Signs Assessment: post-procedure vital signs reviewed and stable Respiratory status: spontaneous breathing, nonlabored ventilation and respiratory function stable Cardiovascular status: blood pressure returned to baseline and stable Postop Assessment: adequate PO intake Anesthetic complications: no   No notable events documented.   Last Vitals:  Vitals:   03/03/23 1039 03/03/23 1050  BP: (!) 127/52 (!) 114/50  Pulse: 63 (!) 58  Resp:    Temp:    SpO2: 100% 99%    Last Pain:  Vitals:   03/03/23 1050  TempSrc:   PainSc: 0-No pain                 Alfonso Ruths

## 2023-03-03 NOTE — Op Note (Signed)
 [facility] Patient Name: Kathleen [name] Procedure Date: 03/03/2023 9:55 AM MRN: [id number] Account #: [id number] Date of Birth: 11/19/1950 Admit Type: Outpatient Age: 73 Room: [facility] ENDO ROOM 3 Gender: Female Note Status: Finalized Instrument Name: Peds Colonoscope [id number] Procedure:             Colonoscopy Indications:           Surveillance: Personal history of adenomatous polyps                         on last colonoscopy 3 years ago Providers:             Ole Schick MD, MD Referring MD:          Rollene Therisa BRAVO, FNP Medicines:             Monitored Anesthesia Care Complications:         No immediate complications. Estimated blood loss:                         Minimal. Procedure:             Pre-Anesthesia Assessment:                        - Prior to the procedure, a History and Physical was                         performed, and patient medications and allergies were                         reviewed. The patient is competent. The risks and                         benefits of the procedure and the sedation options and                         risks were discussed with the patient. All questions                         were answered and informed consent was obtained.                         Patient identification and proposed procedure were                         verified by the physician, the nurse, the                         anesthesiologist, the anesthetist and the technician                         in the endoscopy suite. Mental Status Examination:                         alert and oriented. Airway Examination: normal                         oropharyngeal airway and neck mobility. Respiratory  Examination: clear to auscultation. CV Examination:                         normal. Prophylactic Antibiotics: The patient does not                         require prophylactic antibiotics. Prior                          Anticoagulants: The patient has taken Plavix                          (clopidogrel ), last dose was 3 days prior to                         procedure. ASA Grade Assessment: III - A patient with                         severe systemic disease. After reviewing the risks and                         benefits, the patient was deemed in satisfactory                         condition to undergo the procedure. The anesthesia                         plan was to use monitored anesthesia care (MAC).                         Immediately prior to administration of medications,                         the patient was re-assessed for adequacy to receive                         sedatives. The heart rate, respiratory rate, oxygen                         saturations, blood pressure, adequacy of pulmonary                         ventilation, and response to care were monitored                         throughout the procedure. The physical status of the                         patient was re-assessed after the procedure.                        After obtaining informed consent, the colonoscope was                         passed under direct vision. Throughout the procedure,                         the patient's blood pressure, pulse, and oxygen  saturations were monitored continuously. The                         Colonoscope was introduced through the anus and                         advanced to the the cecum, identified by appendiceal                         orifice and ileocecal valve. The colonoscopy was                         performed without difficulty. The patient tolerated                         the procedure well. The quality of the bowel                         preparation was good. The ileocecal valve, appendiceal                         orifice, and rectum were photographed. Findings:      The perianal and digital rectal examinations were normal.      A single medium-sized  localized angioectasia without bleeding was found       in the cecum.      A 5 mm polyp was found in the ileocecal valve. The polyp was sessile.       The polyp was removed with a cold snare. Resection and retrieval were       complete. Estimated blood loss was minimal.      The exam was otherwise without abnormality on direct and retroflexion       views. Impression:            - A single non-bleeding colonic angioectasia.                        - One 5 mm polyp at the ileocecal valve, removed with                         a cold snare. Resected and retrieved.                        - The examination was otherwise normal on direct and                         retroflexion views. Recommendation:        - Discharge patient to home.                        - Resume previous diet.                        - Continue present medications.                        - Await pathology results.                        - Repeat colonoscopy is not recommended due to current  age (22 years or older) for surveillance.                        - Return to referring physician as previously                         scheduled.                        - Resume Plavix  (clopidogrel ) at prior dose tomorrow. Procedure Code(s):     --- Professional ---                        [id number], Colonoscopy, flexible; with removal of                         tumor(s), polyp(s), or other lesion(s) by snare                         technique Diagnosis Code(s):     --- Professional ---                        Z86.010, Personal history of colonic polyps                        K55.20, Angiodysplasia of colon without hemorrhage                        D12.0, Benign neoplasm of cecum CPT copyright 2022 American Medical Association. All rights reserved. The codes documented in this report are preliminary and upon coder review may  be revised to meet current compliance requirements. Ole Schick MD, MD 03/03/2023  10:30:29 AM Number of Addenda: 0 Note Initiated On: 03/03/2023 9:55 AM Scope Withdrawal Time: 0 hours 11 minutes 41 seconds  Total Procedure Duration: 0 hours 19 minutes 20 seconds  Estimated Blood Loss:  Estimated blood loss was minimal.      [facility]

## 2023-03-03 NOTE — Interval H&P Note (Signed)
 History and Physical Interval Note:  03/03/2023 9:59 AM  Kathleen [name]  has presented today for surgery, with the diagnosis of HX OF ADENOMATOUS  POLYP OF COLON.  The various methods of treatment have been discussed with the patient and family. After consideration of risks, benefits and other options for treatment, the patient has consented to  Procedure(s): COLONOSCOPY WITH PROPOFOL  (N/A) as a surgical intervention.  The patient's history has been reviewed, patient examined, no change in status, stable for surgery.  I have reviewed the patient's chart and labs.  Questions were answered to the patient's satisfaction.     Ole ONEIDA Schick  Ok to proceed with colonoscopy

## 2023-03-03 NOTE — H&P (Signed)
 Outpatient short stay form Pre-procedure 03/03/2023  Kathleen ONEIDA Schick, MD  Primary Physician: Rollene Therisa BRAVO, FNP (Inactive)  Reason for visit:  Surveillance  History of present illness:    73 y/o lady with history of hypothyroidism, CAD on plavix  (last dose 3 days ago), and DM II here for surveillance colonoscopy. Last colonoscopy in 2021 with some Ta's. Discuss blood thinner, we can take small polyps but not large polyps. History of hysterectomy and cholecystectomy.    Current Facility-Administered Medications:    0.9 %  sodium chloride  infusion, , Intravenous, Continuous, Clevester Helzer, Kathleen ONEIDA, MD, Last Rate: 20 mL/hr at 03/03/23 0914, New Bag at 03/03/23 0914  Medications Prior to Admission  Medication Sig Dispense Refill Last Dose/Taking   Calcium  Carbonate-Vitamin D  600-200 MG-UNIT TABS Take 1 tablet by mouth in the morning and at bedtime.   Past Week   isosorbide  mononitrate (IMDUR ) 30 MG 24 hr tablet Take 30 mg by mouth daily.   03/02/2023   levothyroxine  (SYNTHROID ) 75 MCG tablet Take 75 mcg by mouth daily before breakfast.   03/02/2023   linagliptin (TRADJENTA) 5 MG TABS tablet Take 5 mg by mouth daily.   03/02/2023   lisinopril  (PRINIVIL ,ZESTRIL ) 2.5 MG tablet Take 2.5 mg by mouth daily.   03/02/2023   metoprolol  tartrate (LOPRESSOR ) 25 MG tablet Take 25 mg by mouth 2 (two) times daily.   03/02/2023   traZODone  (DESYREL ) 50 MG tablet Take 50 mg by mouth at bedtime as needed for sleep.  1 03/02/2023   vitamin B-12 (CYANOCOBALAMIN ) 1000 MCG tablet Take 1,000 mcg by mouth daily.   03/02/2023   albuterol  (VENTOLIN  HFA) 108 (90 Base) MCG/ACT inhaler Inhale 2 puffs into the lungs every 4 (four) hours as needed for wheezing or shortness of breath.      aspirin  81 MG EC tablet Take 81 mg by mouth daily.   02/28/2023   atorvastatin  (LIPITOR) 40 MG tablet Take 40 mg by mouth daily at 6 PM.      clopidogrel  (PLAVIX ) 75 MG tablet Take 1 tablet (75 mg total) by mouth daily. 30 tablet 11 02/28/2023    empagliflozin (JARDIANCE) 25 MG TABS tablet Take 25 mg by mouth daily.   02/28/2023   glipiZIDE (GLUCOTROL XL) 5 MG 24 hr tablet Take 5 mg by mouth daily with breakfast. (Patient not taking: Reported on 08/06/2021)        No Known Allergies   Past Medical History:  Diagnosis Date   CAD (coronary artery disease)    Cancer (HCC)    skin    HLD (hyperlipidemia)    HTN (hypertension)    Hypothyroidism    Type 2 diabetes mellitus (HCC)     Review of systems:  Otherwise negative.    Physical Exam  Gen: Alert, oriented. Appears stated age.  HEENT: PERRLA. Lungs: No respiratory distress CV: RRR Abd: soft, benign, no masses Ext: No edema    Planned procedures: Proceed with colonoscopy. The patient understands the nature of the planned procedure, indications, risks, alternatives and potential complications including but not limited to bleeding, infection, perforation, damage to internal organs and possible oversedation/side effects from anesthesia. The patient agrees and gives consent to proceed.  Please refer to procedure notes for findings, recommendations and patient disposition/instructions.     Kathleen ONEIDA Schick, MD [facility]

## 2023-03-03 NOTE — Anesthesia Preprocedure Evaluation (Addendum)
 Anesthesia Evaluation  Patient identified by MRN, date of birth, ID band Patient awake    Reviewed: Allergy & Precautions, NPO status , Patient's Chart, lab work & pertinent test results  History of Anesthesia Complications Negative for: history of anesthetic complications  Airway Mallampati: I   Neck ROM: Full    Dental  (+) Missing   Pulmonary former smoker (quit 35 years ago)   Pulmonary exam normal breath sounds clear to auscultation       Cardiovascular hypertension, + CAD (s/p stent) and + Peripheral Vascular Disease (s/p CEA on Plavix )  Normal cardiovascular exam Rhythm:Regular Rate:Normal  Myocardial perfusion 08/12/20:  Normal myocardial perfusion scan no evidence of stress-induced myocardial ischemia ejection fraction of 64% conclusion negative scan   Echo 08/12/20:  NORMAL LEFT VENTRICULAR SYSTOLIC FUNCTION  NORMAL RIGHT VENTRICULAR SYSTOLIC FUNCTION  NO VALVULAR STENOSIS  TRIVIAL MR  MILD TR  EF 55%     Neuro/Psych negative neurological ROS     GI/Hepatic negative GI ROS,,,  Endo/Other  diabetes, Type 2Hypothyroidism    Renal/GU negative Renal ROS     Musculoskeletal   Abdominal   Peds  Hematology negative hematology ROS (+)   Anesthesia Other Findings Cardiology note 12/14/22:  73 y.o. female with  1. Mild tricuspid regurgitation  2. Coronary artery disease involving native coronary artery of native heart without angina pectoris  3. History of coronary artery stent placement  4. Essential hypertension  5. Mixed hyperlipidemia  6. Non-insulin  dependent type 2 diabetes mellitus (CMS/HHS-HCC)  7. Internal carotid artery stent present  8. Bruit of left carotid artery  9. Chronic obstructive pulmonary disease, unspecified COPD type (CMS/HHS-HCC)   Plan  Valvular regurgitation tricuspid valve probably contributing to shortness of breath continue current therapy consider adding diuretic Coronary  disease status post PCI and stent continue aspirin  statin Plavix  Imdur  lisinopril  metoprolol  Diabetes type 2 uncomplicated continue glipizide Jardiance Tradjenta Hyperlipidemia patient maintained on Lipitor therapy for lipid management Hypertension reasonably controlled continue lisinopril  metoprolol  Imdur  COPD continue inhalers as necessary to the referral to pulmonary Former smoker continue to advised patient refrain from tobacco abuse Have the patient follow-up in 1 year  Return in about 1 year (around 12/14/2023).    Reproductive/Obstetrics                             Anesthesia Physical Anesthesia Plan  ASA: 3  Anesthesia Plan: General   Post-op Pain Management:    Induction: Intravenous  PONV Risk Score and Plan: 3 and Propofol  infusion, TIVA and Treatment may vary due to age or medical condition  Airway Management Planned: Natural Airway  Additional Equipment:   Intra-op Plan:   Post-operative Plan:   Informed Consent: I have reviewed the patients History and Physical, chart, labs and discussed the procedure including the risks, benefits and alternatives for the proposed anesthesia with the patient or authorized representative who has indicated his/her understanding and acceptance.       Plan Discussed with: CRNA  Anesthesia Plan Comments: (LMA/GETA backup discussed.  Patient consented for risks of anesthesia including but not limited to:  - adverse reactions to medications - damage to eyes, teeth, lips or other oral mucosa - nerve damage due to positioning  - sore throat or hoarseness - damage to heart, brain, nerves, lungs, other parts of body or loss of life  Informed patient about role of CRNA in peri- and intra-operative care.  Patient voiced understanding.)  Anesthesia Quick Evaluation

## 2023-03-06 ENCOUNTER — Encounter: Payer: Self-pay | Admitting: Gastroenterology

## 2023-03-06 LAB — SURGICAL PATHOLOGY

## 2023-07-18 ENCOUNTER — Encounter (INDEPENDENT_AMBULATORY_CARE_PROVIDER_SITE_OTHER): Payer: Self-pay

## 2023-08-25 ENCOUNTER — Encounter: Payer: Self-pay | Admitting: Family Medicine

## 2023-08-25 ENCOUNTER — Ambulatory Visit (INDEPENDENT_AMBULATORY_CARE_PROVIDER_SITE_OTHER): Admitting: Family Medicine

## 2023-08-25 VITALS — BP 121/62 | HR 60 | Temp 98.0°F | Resp 18 | Ht 63.0 in | Wt 145.0 lb

## 2023-08-25 DIAGNOSIS — I10 Essential (primary) hypertension: Secondary | ICD-10-CM | POA: Diagnosis not present

## 2023-08-25 DIAGNOSIS — E119 Type 2 diabetes mellitus without complications: Secondary | ICD-10-CM | POA: Diagnosis not present

## 2023-08-25 DIAGNOSIS — E118 Type 2 diabetes mellitus with unspecified complications: Secondary | ICD-10-CM | POA: Diagnosis not present

## 2023-08-25 DIAGNOSIS — J410 Simple chronic bronchitis: Secondary | ICD-10-CM

## 2023-08-25 DIAGNOSIS — I6521 Occlusion and stenosis of right carotid artery: Secondary | ICD-10-CM

## 2023-08-25 LAB — POCT GLYCOSYLATED HEMOGLOBIN (HGB A1C): Hemoglobin A1C: 8.4 % — AB (ref 4.0–5.6)

## 2023-08-25 MED ORDER — LISINOPRIL 2.5 MG PO TABS
2.5000 mg | ORAL_TABLET | Freq: Every day | ORAL | 1 refills | Status: DC
Start: 2023-08-25 — End: 2023-12-05

## 2023-08-25 MED ORDER — PIOGLITAZONE HCL 15 MG PO TABS
15.0000 mg | ORAL_TABLET | Freq: Every day | ORAL | 1 refills | Status: DC
Start: 2023-08-25 — End: 2023-12-05

## 2023-08-25 NOTE — Assessment & Plan Note (Addendum)
 She has a 30-pack-year history of smoking.  She quit smoking 27 years ago.  She does not qualify for lung cancer screening.  Reports no shortness of breath with exertion.  Is not on any controlling medication and is doing well.  Has albuterol  that she uses as needed which is approximately once a month.

## 2023-08-25 NOTE — Progress Notes (Unsigned)
 New Patient Office Visit  Subjective    Patient ID: Kathleen [name], female    DOB: [birth year]  Age: 73 y.o. MRN: [id number]  CC:  Chief Complaint  Patient presents with  . Establish Care    [name] presents to establish care Delightful 73 year old here to establish care.  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  She lives independently and drives.   She has CAD and a cardiac stent placed approximately 15 years ago.  She is followed by Dr. Florencio at [facility] and she goes to cardiology once a year.  She denies any chest pains with exertion  She has a right carotid stent and is followed by Vascular Surgery annually. She has COPD she smoked about 30 years at 1 pack/day but quit 20 years ago.  She has an albuterol  inhaler that she uses maybe once a month. She has diabetes type 2 and is on acarbose 25 mg 3 times daily, glipizide 5 mg ER and 10 mg ER daily, Jardiance 25 mg daily and Tradjenta 5 mg.  She reports no low blood sugars or headaches, confusion, diaphoresis, shakiness.  She has not seen an eye doctor this year but does go yearly. She has hypertension and is on lisinopril  2.5 mg daily and metoprolol  tartrate 25 mg twice daily.  Blood pressure today is 121/62. She has Raynaud's and does not take any medication for this but wears gloves in the wintertime.   She had a colonoscopy January 2025 and had 1 polyp.  She was not given a follow-up time. She has elevated cholesterol and is on atorvastatin  80 mg.  She reports no difficulty taking this and denies having muscle pain. She reports she is due for a mammogram and would like to go to normal breast clinic. She had bilateral cataract extractions with lens implants.  She is status post vaginal hysterectomy and laparoscopic cholecystectomy.  She has a right carotid stent (11/22)  Outpatient Encounter Medications as of 08/25/2023  Medication Sig  . acarbose (PRECOSE) 25 MG tablet Take  25 mg by mouth 3 (three) times daily.  . albuterol  (VENTOLIN  HFA) 108 (90 Base) MCG/ACT inhaler Inhale 2 puffs into the lungs every 4 (four) hours as needed for wheezing or shortness of breath.  . aspirin  81 MG EC tablet Take 81 mg by mouth daily.  . atorvastatin  (LIPITOR) 40 MG tablet Take 40 mg by mouth daily at 6 PM.  . Calcium  Carbonate-Vitamin D  600-200 MG-UNIT TABS Take 1 tablet by mouth in the morning and at bedtime.  . clopidogrel  (PLAVIX ) 75 MG tablet Take 1 tablet (75 mg total) by mouth daily.  . empagliflozin (JARDIANCE) 25 MG TABS tablet Take 25 mg by mouth daily.  SABRA glipiZIDE (GLUCOTROL XL) 5 MG 24 hr tablet Take 5 mg by mouth daily with breakfast.  . glipiZIDE (GLUCOTROL) 10 MG tablet Take 10 mg by mouth daily before breakfast.  . isosorbide  mononitrate (IMDUR ) 30 MG 24 hr tablet Take 30 mg by mouth daily.  . levothyroxine  (SYNTHROID ) 75 MCG tablet Take 75 mcg by mouth daily before breakfast.  . linagliptin (TRADJENTA) 5 MG TABS tablet Take 5 mg by mouth daily.  . metoprolol  tartrate (LOPRESSOR ) 25 MG tablet Take 25 mg by mouth 2 (two) times daily.  . traZODone  (DESYREL ) 50 MG tablet Take 50 mg by mouth at bedtime as needed for sleep.  . vitamin B-12 (CYANOCOBALAMIN ) 1000 MCG tablet Take  1,000 mcg by mouth daily.  . [DISCONTINUED] lisinopril  (PRINIVIL ,ZESTRIL ) 2.5 MG tablet Take 2.5 mg by mouth daily.  . lisinopril  (ZESTRIL ) 2.5 MG tablet Take 1 tablet (2.5 mg total) by mouth daily.   No facility-administered encounter medications on file as of 08/25/2023.    Past Medical History:  Diagnosis Date  . CAD (coronary artery disease)   . Cancer (HCC)    skin   . HLD (hyperlipidemia)   . HTN (hypertension)   . Hypothyroidism   . Type 2 diabetes mellitus (HCC)     Past Surgical History:  Procedure Laterality Date  . CAROTID PTA/STENT INTERVENTION N/A 12/29/2020   Procedure: CAROTID PTA/STENT INTERVENTION;  Surgeon: Jama Cordella MATSU, MD;  Location: [facility] IN[facility]ASIVE [facility];   Service: Cardiovascular;  Laterality: N/A;  . CHOLECYSTECTOMY    . COLONOSCOPY WITH PROPOFOL      X2  . COLONOSCOPY WITH PROPOFOL  N/A 03/03/2023   Procedure: COLONOSCOPY WITH PROPOFOL ;  Surgeon: Maryruth Ole DASEN, MD;  Location: [facility] ENDOSCOPY;  Service: Endoscopy;  Laterality: N/A;  . POLYPECTOMY  03/03/2023   Procedure: POLYPECTOMY;  Surgeon: Maryruth Ole DASEN, MD;  Location: [facility] ENDOSCOPY;  Service: Endoscopy;;  . VAGINAL HYSTERECTOMY      Family History  Problem Relation Age of Onset  . Heart failure Father   . Mental illness Mother   . Breast cancer Neg Hx     Social History   Socioeconomic History  . Marital status: Divorced    Spouse name: Not on file  . Number of [name]: 3  . Years of education: Not on file  . Highest education level: Not on file  Occupational History  . Not on file  Tobacco Use  . Smoking status: Former    Passive exposure: Past  . Smokeless tobacco: Never  Vaping Use  . Vaping status: Never Used  Substance and Sexual Activity  . Alcohol use: No    Alcohol/week: 0.0 standard drinks of alcohol  . Drug use: No  . Sexual activity: Not on file  Other Topics Concern  . Not on file  Social History Narrative   Lost a child 2 years ago.    Lives by herself: daughter Kathleen [name] lives next door.   Social Drivers of Corporate investment banker Strain: Not on file  Food Insecurity: Not on file  Transportation Needs: Not on file  Physical Activity: Not on file  Stress: Not on file  Social Connections: Not on file  Intimate Partner Violence: Not on file    ROS      Objective   BP 121/62 (BP Location: Left Arm, Patient Position: Sitting, Cuff Size: Normal)   Pulse 60   Temp 98 F (36.7 C) (Oral)   Resp 18   Ht 5' 3 (1.6 m)   Wt 145 lb (65.8 kg)   SpO2 97%   BMI 25.69 kg/m  {Vitals History (Optional):23777}  Physical Exam Vitals and nursing note reviewed.  Constitutional:      Appearance: Normal appearance.  HENT:     Head:  Normocephalic and atraumatic.   Eyes:     Conjunctiva/sclera: Conjunctivae normal.    Cardiovascular:     Rate and Rhythm: Normal rate and regular rhythm.     Pulses:          Dorsalis pedis pulses are 2+ on the right side and 2+ on the left side.       Posterior tibial pulses are 2+ on the right side and 2+ on  the left side.  Pulmonary:     Effort: Pulmonary effort is normal.     Breath sounds: Normal breath sounds.   Musculoskeletal:     Right lower leg: No edema.     Left lower leg: No edema.  Feet:     Right foot:     Protective Sensation: 5 sites tested.  5 sites sensed.     Toenail Condition: Fungal disease present.    Left foot:     Protective Sensation: 5 sites tested.  5 sites sensed.     Toenail Condition: Fungal disease present.    Comments: She has hair on her toes  Skin:    General: Skin is warm and dry.   Neurological:     Mental Status: She is alert and oriented to person, place, and time.   Psychiatric:        Mood and Affect: Mood normal.        Behavior: Behavior normal.        Thought Content: Thought content normal.        Judgment: Judgment normal.     {Perform Simple Foot Exam  Perform Detailed exam:1} {Insert foot Exam (Optional):30965}   {Labs (Optional):23779}  The ASCVD Risk score (Arnett DK, et al., 2019) failed to calculate for the following reasons:   Cannot find a previous HDL lab   Cannot find a previous total cholesterol lab     Assessment & Plan:  Type 2 diabetes mellitus without complication, unspecified whether long term insulin  use (HCC) -     POCT glycosylated hemoglobin (Hb A1C) -     Microalbumin / creatinine urine ratio  Primary hypertension -     Lisinopril ; Take 1 tablet (2.5 mg total) by mouth daily.  Dispense: 90 tablet; Refill: 1 -     Lipid panel -     CBC with Differential/Platelet -     Comprehensive metabolic panel with GFR -     TSH + free T4    Return in about 3 months (around 11/25/2023).   Jacki Couse K  Rosemary Mossbarger, MD

## 2023-08-25 NOTE — Assessment & Plan Note (Signed)
 A1c is 8.4% today.  On active both 25 3 times daily Tradjenta 5 mg daily glipizide ER 5 and glipizide ER 10 daily and Jardiance 25 mg.  Adding Actos 15 mg daily and follow-up in 3 months.  Advised to see the eye doctor and a retinal check.

## 2023-08-25 NOTE — Assessment & Plan Note (Signed)
 She is on Plavix  75 mg daily and atorvastatin  80 mg.  Checking her fasting lipids.  Goal is LDL 50 or less

## 2023-08-26 LAB — MICROALBUMIN / CREATININE URINE RATIO
Creatinine, Urine: 59 mg/dL
Microalb/Creat Ratio: 16 mg/g{creat} (ref 0–29)
Microalbumin, Urine: 9.3 ug/mL

## 2023-08-26 LAB — CBC WITH DIFFERENTIAL/PLATELET
Basophils Absolute: 0.1 10*3/uL (ref 0.0–0.2)
Basos: 1 %
EOS (ABSOLUTE): 0.2 10*3/uL (ref 0.0–0.4)
Eos: 2 %
Hematocrit: 42.8 % (ref 34.0–46.6)
Hemoglobin: 13.3 g/dL (ref 11.1–15.9)
Immature Grans (Abs): 0.1 10*3/uL (ref 0.0–0.1)
Immature Granulocytes: 1 %
Lymphocytes Absolute: 1.3 10*3/uL (ref 0.7–3.1)
Lymphs: 17 %
MCH: 29 pg (ref 26.6–33.0)
MCHC: 31.1 g/dL — ABNORMAL LOW (ref 31.5–35.7)
MCV: 93 fL (ref 79–97)
Monocytes Absolute: 0.7 10*3/uL (ref 0.1–0.9)
Monocytes: 9 %
Neutrophils Absolute: 5.5 10*3/uL (ref 1.4–7.0)
Neutrophils: 70 %
Platelets: 152 10*3/uL (ref 150–450)
RBC: 4.58 x10E6/uL (ref 3.77–5.28)
RDW: 13.1 % (ref 11.7–15.4)
WBC: 7.7 10*3/uL (ref 3.4–10.8)

## 2023-08-26 LAB — COMPREHENSIVE METABOLIC PANEL WITH GFR
ALT: 18 IU/L (ref 0–32)
AST: 16 IU/L (ref 0–40)
Albumin: 4.2 g/dL (ref 3.8–4.8)
Alkaline Phosphatase: 122 IU/L — ABNORMAL HIGH (ref 44–121)
BUN/Creatinine Ratio: 20 (ref 12–28)
BUN: 17 mg/dL (ref 8–27)
Bilirubin Total: 0.7 mg/dL (ref 0.0–1.2)
CO2: 22 mmol/L (ref 20–29)
Calcium: 9.5 mg/dL (ref 8.7–10.3)
Chloride: 105 mmol/L (ref 96–106)
Creatinine, Ser: 0.83 mg/dL (ref 0.57–1.00)
Globulin, Total: 2.3 g/dL (ref 1.5–4.5)
Glucose: 141 mg/dL — ABNORMAL HIGH (ref 70–99)
Potassium: 4.5 mmol/L (ref 3.5–5.2)
Sodium: 142 mmol/L (ref 134–144)
Total Protein: 6.5 g/dL (ref 6.0–8.5)
eGFR: 75 mL/min/{1.73_m2} (ref >59)

## 2023-08-26 LAB — LIPID PANEL
Chol/HDL Ratio: 2.6 ratio (ref 0.0–4.4)
Cholesterol, Total: 100 mg/dL (ref 100–199)
HDL: 38 mg/dL — ABNORMAL LOW (ref >39)
LDL Chol Calc (NIH): 41 mg/dL (ref 0–99)
Triglycerides: 116 mg/dL (ref 0–149)
VLDL Cholesterol Cal: 21 mg/dL (ref 5–40)

## 2023-08-26 LAB — TSH+FREE T4
Free T4: 1.36 ng/dL (ref 0.82–1.77)
TSH: 1.12 u[IU]/mL (ref 0.450–4.500)

## 2023-08-28 ENCOUNTER — Ambulatory Visit: Payer: Self-pay | Admitting: Family Medicine

## 2023-08-28 NOTE — Assessment & Plan Note (Signed)
 Good blood pressure control and a good regimen for a type II diabetic.

## 2023-09-04 ENCOUNTER — Other Ambulatory Visit: Payer: Self-pay | Admitting: Family Medicine

## 2023-09-04 MED ORDER — METOPROLOL TARTRATE 25 MG PO TABS: 25.0000 mg | ORAL_TABLET | Freq: Two times a day (BID) | ORAL | 0 refills | Status: DC

## 2023-09-04 NOTE — Telephone Encounter (Signed)
 Copied from CRM [id number]. Topic: Clinical - Medication Refill >> Sep 04, 2023  2:00 PM Sasha H wrote: Medication: metoprolol  tartrate (LOPRESSOR ) 25 MG tablet  Has the patient contacted their pharmacy? Yes (Agent: If no, request that the patient contact the pharmacy for the refill. If patient does not wish to contact the pharmacy document the reason why and proceed with request.) (Agent: If yes, when and what did the pharmacy advise?)  This is the patient's preferred pharmacy:  [id number], [place] - [address] AT [facility] OF [address] [place] [address] Phone: [phone] Fax: [phone]   Is this the correct pharmacy for this prescription? Yes If no, delete pharmacy and type the correct one.   Has the prescription been filled recently? Yes  Is the patient out of the medication? No  Has the patient been seen for an appointment in the last year OR does the patient have an upcoming appointment? Yes  Can we respond through MyChart? Yes  Agent: Please be advised that Rx refills may take up to 3 business days. We ask that you follow-up with your pharmacy.

## 2023-09-27 ENCOUNTER — Encounter: Payer: Self-pay | Admitting: Emergency Medicine

## 2023-09-27 ENCOUNTER — Other Ambulatory Visit: Payer: Self-pay | Admitting: Family Medicine

## 2023-09-27 DIAGNOSIS — Z1231 Encounter for screening mammogram for malignant neoplasm of breast: Secondary | ICD-10-CM

## 2023-10-11 ENCOUNTER — Telehealth: Payer: Self-pay | Admitting: Family Medicine

## 2023-10-11 NOTE — Telephone Encounter (Signed)
 Copied from CRM [id number]. Topic: General - Deceased Patient >> 2023-10-21 10:12 AM Porter CROME wrote: Name of caller: lynwood  Date of death: 10/15/2023   Name of funeral home: [facility]  Phone number of funeral home: [phone]  Provider that needs to sign form: susan ziglar  Timeline for signing: dave case number is [id number]

## 2023-10-13 ENCOUNTER — Encounter

## 2023-10-30 DEATH — deceased

## 2023-11-16 ENCOUNTER — Telehealth: Payer: Self-pay

## 2023-11-16 NOTE — Telephone Encounter (Signed)
 Left VM need to schedule AWV.

## 2023-11-24 ENCOUNTER — Ambulatory Visit: Admitting: Family Medicine

## 2024-02-09 ENCOUNTER — Ambulatory Visit (INDEPENDENT_AMBULATORY_CARE_PROVIDER_SITE_OTHER): Payer: Medicare HMO | Admitting: Nurse Practitioner

## 2024-02-09 ENCOUNTER — Encounter (INDEPENDENT_AMBULATORY_CARE_PROVIDER_SITE_OTHER): Payer: Medicare HMO
